# Patient Record
Sex: Male | Born: 1950
Health system: Southern US, Community
[De-identification: ages and names within clinical notes are randomized; demographics above are authoritative.]

## PROBLEM LIST (undated history)

## (undated) DIAGNOSIS — M109 Gout, unspecified: Secondary | ICD-10-CM

## (undated) DIAGNOSIS — M1990 Unspecified osteoarthritis, unspecified site: Secondary | ICD-10-CM

## (undated) DIAGNOSIS — H269 Unspecified cataract: Secondary | ICD-10-CM

## (undated) DIAGNOSIS — N200 Calculus of kidney: Secondary | ICD-10-CM

## (undated) DIAGNOSIS — E785 Hyperlipidemia, unspecified: Secondary | ICD-10-CM

## (undated) DIAGNOSIS — R9431 Abnormal electrocardiogram [ECG] [EKG]: Secondary | ICD-10-CM

## (undated) DIAGNOSIS — K635 Polyp of colon: Secondary | ICD-10-CM

## (undated) DIAGNOSIS — R002 Palpitations: Secondary | ICD-10-CM

## (undated) DIAGNOSIS — I10 Essential (primary) hypertension: Secondary | ICD-10-CM

## (undated) DIAGNOSIS — K219 Gastro-esophageal reflux disease without esophagitis: Secondary | ICD-10-CM

## (undated) DIAGNOSIS — I119 Hypertensive heart disease without heart failure: Secondary | ICD-10-CM

## (undated) DIAGNOSIS — G709 Myoneural disorder, unspecified: Secondary | ICD-10-CM

## (undated) HISTORY — DX: Polyp of colon: K63.5

## (undated) HISTORY — DX: Palpitations: R00.2

## (undated) HISTORY — PX: CORONARY ANGIOPLASTY: SHX604

## (undated) HISTORY — DX: Calculus of kidney: N20.0

## (undated) HISTORY — DX: Hypertensive heart disease without heart failure: I11.9

## (undated) HISTORY — DX: Unspecified cataract: H26.9

## (undated) HISTORY — DX: Unspecified osteoarthritis, unspecified site: M19.90

## (undated) HISTORY — DX: Gout, unspecified: M10.9

## (undated) HISTORY — DX: Hyperlipidemia, unspecified: E78.5

## (undated) HISTORY — DX: Gastro-esophageal reflux disease without esophagitis: K21.9

## (undated) HISTORY — DX: Abnormal electrocardiogram (ECG) (EKG): R94.31

## (undated) HISTORY — DX: Myoneural disorder, unspecified: G70.9

## (undated) HISTORY — DX: Essential (primary) hypertension: I10

---

## 2015-10-21 DIAGNOSIS — E785 Hyperlipidemia, unspecified: Secondary | ICD-10-CM | POA: Diagnosis not present

## 2015-10-21 DIAGNOSIS — K219 Gastro-esophageal reflux disease without esophagitis: Secondary | ICD-10-CM | POA: Diagnosis not present

## 2015-10-21 DIAGNOSIS — I517 Cardiomegaly: Secondary | ICD-10-CM | POA: Diagnosis not present

## 2015-10-21 DIAGNOSIS — I10 Essential (primary) hypertension: Secondary | ICD-10-CM | POA: Diagnosis not present

## 2015-10-21 DIAGNOSIS — E876 Hypokalemia: Secondary | ICD-10-CM | POA: Diagnosis not present

## 2015-10-21 DIAGNOSIS — R7309 Other abnormal glucose: Secondary | ICD-10-CM | POA: Diagnosis not present

## 2015-10-21 DIAGNOSIS — Z833 Family history of diabetes mellitus: Secondary | ICD-10-CM | POA: Diagnosis not present

## 2015-10-21 DIAGNOSIS — E7800 Pure hypercholesterolemia, unspecified: Secondary | ICD-10-CM | POA: Diagnosis not present

## 2015-11-15 DIAGNOSIS — I517 Cardiomegaly: Secondary | ICD-10-CM | POA: Diagnosis not present

## 2015-11-15 DIAGNOSIS — E785 Hyperlipidemia, unspecified: Secondary | ICD-10-CM | POA: Diagnosis not present

## 2015-11-15 DIAGNOSIS — K219 Gastro-esophageal reflux disease without esophagitis: Secondary | ICD-10-CM | POA: Diagnosis not present

## 2015-11-15 DIAGNOSIS — I10 Essential (primary) hypertension: Secondary | ICD-10-CM | POA: Diagnosis not present

## 2015-12-15 DIAGNOSIS — R7309 Other abnormal glucose: Secondary | ICD-10-CM | POA: Diagnosis not present

## 2015-12-15 DIAGNOSIS — Z125 Encounter for screening for malignant neoplasm of prostate: Secondary | ICD-10-CM | POA: Diagnosis not present

## 2015-12-15 DIAGNOSIS — I517 Cardiomegaly: Secondary | ICD-10-CM | POA: Diagnosis not present

## 2015-12-15 DIAGNOSIS — E785 Hyperlipidemia, unspecified: Secondary | ICD-10-CM | POA: Diagnosis not present

## 2015-12-15 DIAGNOSIS — I10 Essential (primary) hypertension: Secondary | ICD-10-CM | POA: Diagnosis not present

## 2016-01-07 DIAGNOSIS — R9431 Abnormal electrocardiogram [ECG] [EKG]: Secondary | ICD-10-CM | POA: Diagnosis not present

## 2016-01-07 DIAGNOSIS — I10 Essential (primary) hypertension: Secondary | ICD-10-CM | POA: Diagnosis not present

## 2016-01-07 DIAGNOSIS — I119 Hypertensive heart disease without heart failure: Secondary | ICD-10-CM | POA: Diagnosis not present

## 2016-02-03 DIAGNOSIS — I119 Hypertensive heart disease without heart failure: Secondary | ICD-10-CM | POA: Diagnosis not present

## 2016-02-21 DIAGNOSIS — Z0000 Encounter for general adult medical examination without abnormal findings: Secondary | ICD-10-CM | POA: Diagnosis not present

## 2016-02-21 DIAGNOSIS — N400 Enlarged prostate without lower urinary tract symptoms: Secondary | ICD-10-CM | POA: Diagnosis not present

## 2016-02-21 DIAGNOSIS — Z125 Encounter for screening for malignant neoplasm of prostate: Secondary | ICD-10-CM | POA: Diagnosis not present

## 2016-02-21 DIAGNOSIS — Q615 Medullary cystic kidney: Secondary | ICD-10-CM | POA: Diagnosis not present

## 2016-02-23 DIAGNOSIS — K219 Gastro-esophageal reflux disease without esophagitis: Secondary | ICD-10-CM | POA: Diagnosis not present

## 2016-02-23 DIAGNOSIS — R7309 Other abnormal glucose: Secondary | ICD-10-CM | POA: Diagnosis not present

## 2016-02-23 DIAGNOSIS — I10 Essential (primary) hypertension: Secondary | ICD-10-CM | POA: Diagnosis not present

## 2016-02-23 DIAGNOSIS — E785 Hyperlipidemia, unspecified: Secondary | ICD-10-CM | POA: Diagnosis not present

## 2016-06-06 DIAGNOSIS — G622 Polyneuropathy due to other toxic agents: Secondary | ICD-10-CM | POA: Diagnosis not present

## 2016-06-06 DIAGNOSIS — I10 Essential (primary) hypertension: Secondary | ICD-10-CM | POA: Diagnosis not present

## 2016-06-06 DIAGNOSIS — E7800 Pure hypercholesterolemia, unspecified: Secondary | ICD-10-CM | POA: Diagnosis not present

## 2016-06-06 DIAGNOSIS — E785 Hyperlipidemia, unspecified: Secondary | ICD-10-CM | POA: Diagnosis not present

## 2016-06-06 DIAGNOSIS — E538 Deficiency of other specified B group vitamins: Secondary | ICD-10-CM | POA: Diagnosis not present

## 2016-06-07 LAB — BASIC METABOLIC PANEL
BUN: 12 mg/dL (ref 4–21)
CREATININE: 1.2 mg/dL (ref 0.6–1.3)
GLUCOSE: 82 mg/dL
Potassium: 3.9 mmol/L (ref 3.4–5.3)
Sodium: 140 mmol/L (ref 137–147)

## 2016-06-07 LAB — LIPID PANEL
Cholesterol: 187 mg/dL (ref 0–200)
HDL: 53 mg/dL (ref 35–70)
LDL CALC: 108 mg/dL
TRIGLYCERIDES: 129 mg/dL (ref 40–160)

## 2016-06-07 LAB — HEPATIC FUNCTION PANEL
ALK PHOS: 88 U/L (ref 25–125)
ALT: 15 U/L (ref 10–40)
AST: 20 U/L (ref 14–40)
BILIRUBIN, TOTAL: 0.6 mg/dL

## 2016-06-07 LAB — CBC AND DIFFERENTIAL
HCT: 40 % — AB (ref 41–53)
Hemoglobin: 13 g/dL — AB (ref 13.5–17.5)
NEUTROS ABS: 58 /uL
PLATELETS: 209 10*3/uL (ref 150–399)
WBC: 5 10*3/mL

## 2016-06-08 ENCOUNTER — Ambulatory Visit (INDEPENDENT_AMBULATORY_CARE_PROVIDER_SITE_OTHER): Payer: Medicare Other | Admitting: Neurology

## 2016-06-08 ENCOUNTER — Encounter: Payer: Self-pay | Admitting: Neurology

## 2016-06-08 VITALS — BP 132/70 | HR 78 | Resp 14 | Ht 67.0 in | Wt 168.0 lb

## 2016-06-08 DIAGNOSIS — G629 Polyneuropathy, unspecified: Secondary | ICD-10-CM

## 2016-06-08 DIAGNOSIS — M79601 Pain in right arm: Secondary | ICD-10-CM

## 2016-06-08 DIAGNOSIS — R202 Paresthesia of skin: Secondary | ICD-10-CM | POA: Diagnosis not present

## 2016-06-08 DIAGNOSIS — M79602 Pain in left arm: Principal | ICD-10-CM

## 2016-06-08 DIAGNOSIS — M79603 Pain in arm, unspecified: Secondary | ICD-10-CM | POA: Diagnosis not present

## 2016-06-08 NOTE — Progress Notes (Signed)
Subjective:    Patient ID: Scott Graves is a 65 y.o. male.  HPI     [name], MD, PhD [facility] [address], [address].O. [place],  [address]     Dear Dr. Criss Rosales,   I saw your patient, Scott Graves, upon your kind request in my neurologic clinic today for initial consultation of his paresthesias in his feet, concern for polyneuropathy. The patient is unaccompanied today. As you know, Scott Graves is a 65 year old right-handed gentleman with an underlying medical history of heart disease, hypertension, hyperlipidemia, reflux disease, gout, and overweight state, who reports pins and needles like sensation in his feet, mostly at night, with symptoms starting about a month ago. He denies any trauma, no falls, no injuries, no weakness, maybe symptoms have plateaued. Symptoms started 1 night as he was in bed with a prick that he felt in his left big toe on the bottom, felt like something had stung him or bitten him, in fact, he searched the bed and bedroom for bugs. He does state that he has had problems with bugs in or around the house and he was spraying pesticide about 3-4 weeks ago and he also had the exterminator come in for treatment in or around the house. Since then, he has intermittent sensation of a prickly feeling in both feet, sometimes in his hands, symptoms seem to be intermittent, no constant numbness, no actual pain but he does have an uncomfortable prickly sensation first and then tingling. He reports no history of diabetes. He has no family history of neuropathy. One brother passed away after stroke. He is the middle child of 9 children. One brother died at age 69 with diabetes and history of alcoholism. Another brother passed away at age 55 with HIV. Another brother died at age 6 with arthritis and history of COPD. He has a brother age 46 who has hypertension and history of stroke. Mother lived to be 37 years old, father lived to be 71 years old.  He has a sister age 45 with hypertension, another younger sister, age 13, with breast cancer and a younger sister age 23. He lives with his wife. He has 1 biological daughter, 2 stepdaughters. He drinks alcohol very occasionally, maybe twice a month, never heavy drinker. Lifelong nonsmoker. He was in the [employer] for 29 years, in [place], [place] and [place]. He was checked for HIV in 2011 at the [place] he says, results negative per patient. He had blood work in your office and he says he was told blood work was fine. We will request test results from your office. I reviewed your office note from 06/06/2016, which you kindly included.  His Past Medical History Is Significant For: Past Medical History:  Diagnosis Date  . Hyperlipemia   . Hypertension     His Past Surgical History Is Significant For: No past surgical history on file.  His Family History Is Significant For: Family History  Problem Relation Age of Onset  . Hypertension Mother   . Diabetes Mother   . Hyperlipidemia Mother   . Hypertension Father   . Hypertension Sister   . Hyperlipidemia Sister   . Heart disease Sister     His Social History Is Significant For: Social History   Social History  . Marital status: Married    Spouse name: N/A  . Number of children: 1  . Years of education: HS   Occupational History  . Retired    Social History Main  Topics  . Smoking status: Never Smoker  . Smokeless tobacco: None  . Alcohol use Yes  . Drug use: No  . Sexual activity: Not Asked   Other Topics Concern  . None   Social History Narrative   Rare caffeine use     His Allergies Are:  Allergies  Allergen Reactions  . Statins   :   His Current Medications Are:  Outpatient Encounter Prescriptions as of 06/08/2016  Medication Sig  . allopurinol (ZYLOPRIM) 300 MG tablet Take 300 mg by mouth daily.  Marland Kitchen MICARDIS HCT 40-12.5 MG tablet   . omeprazole (PRILOSEC) 40 MG capsule   . potassium chloride (K-DUR) 10 MEQ  tablet Take 10 mEq by mouth daily.  Marland Kitchen ZETIA 10 MG tablet    No facility-administered encounter medications on file as of 06/08/2016.   :   Review of Systems:  Out of a complete 14 point review of systems, all are reviewed and negative with the exception of these symptoms as listed below:  Review of Systems  Neurological:       Patient is complaining of paresthesia in feet, legs, hands and arms. This started about 3 weeks ago.     Objective:  Neurologic Exam  Physical Exam Physical Examination:   Vitals:   06/08/16 1606  BP: 132/70  Pulse: 78  Resp: 14    General Examination: The patient is a very pleasant 65 y.o. male in no acute distress. He appears well-developed and well groomed.   HEENT: Normocephalic, atraumatic, pupils are equal, round and reactive to light and accommodation. Funduscopic exam is normal with sharp disc margins noted. Extraocular tracking is good without limitation to gaze excursion or nystagmus noted. Normal smooth pursuit is noted. Hearing is grossly intact. Tympanic membranes are clear bilaterally. Face is symmetric with normal facial animation and normal facial sensation. Speech is clear with no dysarthria noted. There is no hypophonia. There is no lip, neck/head, jaw or voice tremor. Neck is supple with full range of passive and active motion. There are no carotid bruits on auscultation. Oropharynx exam reveals: mild mouth dryness, adequate dental hygiene and mild airway crowding. Tongue protrudes centrally and palate elevates symmetrically.   Chest: Clear to auscultation without wheezing, rhonchi or crackles noted.  Heart: S1+S2+0, regular and normal without murmurs, rubs or gallops noted.   Abdomen: Soft, non-tender and non-distended with normal bowel sounds appreciated on auscultation.  Extremities: There is no pitting edema in the distal lower extremities bilaterally. Pedal pulses are intact.  Skin: Warm and dry without trophic changes noted. There  are no varicose veins.  Musculoskeletal: exam reveals no obvious joint deformities, tenderness or joint swelling or erythema.   Neurologically:  Mental status: The patient is awake, alert and oriented in all 4 spheres. His immediate and remote memory, attention, language skills and fund of knowledge are appropriate. There is no evidence of aphasia, agnosia, apraxia or anomia. Speech is clear with normal prosody and enunciation. Thought process is linear. Mood is normal and affect is normal.  Cranial nerves II - XII are as described above under HEENT exam. In addition: shoulder shrug is normal with equal shoulder height noted. Motor exam: Normal bulk, strength and tone is noted. There is no drift, tremor or rebound. Romberg is negative. Reflexes are 2 + throughout, including ankles. Toes are downgoing bilaterally. Fine motor skills with finger taps, hand movements, rapid alternating patting, foot taps and foot agility are all normal. Cerebellar testing: No dysmetria or intention tremor on  finger to nose testing. Heel to shin is unremarkable bilaterally. There is no truncal or gait ataxia.  Sensory exam: intact to light touch, pinprick, vibration, temperature sense and in the upper and lower extremities, with the exception of decreased pinprick sensation in both feet, and the soles, and in the toes as well as in the palms of both hands.  Gait, station and balance: He stands easily. No veering to one side is noted. No leaning to one side is noted. Posture is age-appropriate and stance is narrow based. Gait shows normal stride length and normal pace. No problems turning are noted. Tandem walk is unremarkable.   Assessment and Plan:   In summary, Scott Graves is a very pleasant 65 y.o.-year old male  with an underlying medical history of heart disease, hypertension, hyperlipidemia, reflux disease, gout, and overweight state, who presents with a 3 to four-week history of paresthesias in both lower and to a  lesser degree in both upper extremities distally, describes a pins and needles sensation, a sharp prick followed by tingling at times. Symptoms seem to be intermittent. On examination he does not have a sinister exam but does have some decrease in pinprick sensation in both feet in the soles particularly and toes, and to some degree in both palms, maybe fingertips. Otherwise he has a very good neurological exam, nonfocal, no atrophy, no fasciculations, no weakness, great coordination, good balance and gait for age. I talked to the patient at length about his symptoms and presentation. We talked about polyneuropathy, etiology of his symptoms is currently unclear.  He had some recent blood work in your office and we will await test results. Diabetes is the most common cause in this [place] for neuropathy. He is not labeled diabetic. He recalls that he probably had a B12 level checked. Nevertheless, we will wait out test results, I will consider further additional blood work such as IFE and SPEP, heavy metal testing, and he is inquiring about Lyme disease. He does not report any tick bite but is not sure because he had several boxes in the house he worries about and even though he does not have a rash, which can certainly look into acute Lyme disease as well. At this juncture, I suggested we proceed with scheduling him for an EMG and nerve conduction test. We will call him with his test results. We will possibly order additional lab work in the near future and he can come in for lab work independently. We will keep them posted as to his test results. I will see him back routinely in a couple months, sooner as needed. I answered all his questions today and he was in agreement.  Thank you very much for allowing me to participate in the care of this nice patient. If I can be of any further assistance to you please do not hesitate to call me at [phone].  Sincerely,   [name], MD, PhD

## 2016-06-08 NOTE — Patient Instructions (Signed)
I would like to investigate things further to look for evidence of neuropathy or nerve damage; therefore, I would like to do some blood work - we will do it at a later date, as you had blood work with Dr. Criss Rosales, and I am waiting for results.   As discussed, we will do electrical testing of your muscles and nerves, which is known as EMG/NCV. Neuropathy or nerve disease or damage can be caused by a variety of causes, most commonly diabetes, some toxins including alcohol or metabolic derangements or hereditary disorders.

## 2016-06-12 DIAGNOSIS — L309 Dermatitis, unspecified: Secondary | ICD-10-CM | POA: Diagnosis not present

## 2016-06-14 ENCOUNTER — Telehealth: Payer: Self-pay | Admitting: Neurology

## 2016-06-14 ENCOUNTER — Encounter: Payer: Self-pay | Admitting: Neurology

## 2016-06-14 DIAGNOSIS — G629 Polyneuropathy, unspecified: Secondary | ICD-10-CM

## 2016-06-14 DIAGNOSIS — M79671 Pain in right foot: Secondary | ICD-10-CM

## 2016-06-14 DIAGNOSIS — M79672 Pain in left foot: Secondary | ICD-10-CM

## 2016-06-14 DIAGNOSIS — R209 Unspecified disturbances of skin sensation: Principal | ICD-10-CM

## 2016-06-14 DIAGNOSIS — IMO0001 Reserved for inherently not codable concepts without codable children: Secondary | ICD-10-CM

## 2016-06-14 NOTE — Telephone Encounter (Signed)
Please call patient to let him know that I reviewed the labs that he had at Dr. Fransico Setters office. I entered additional labs we talked about before and he can come and get him drawn any time.

## 2016-06-14 NOTE — Telephone Encounter (Signed)
LM with information below. I gave our lab hours at this office. Left call back number for further questions.

## 2016-06-15 ENCOUNTER — Other Ambulatory Visit (INDEPENDENT_AMBULATORY_CARE_PROVIDER_SITE_OTHER): Payer: Self-pay

## 2016-06-15 ENCOUNTER — Other Ambulatory Visit: Payer: Self-pay

## 2016-06-15 DIAGNOSIS — G629 Polyneuropathy, unspecified: Secondary | ICD-10-CM

## 2016-06-15 DIAGNOSIS — R202 Paresthesia of skin: Secondary | ICD-10-CM

## 2016-06-15 DIAGNOSIS — M79603 Pain in arm, unspecified: Secondary | ICD-10-CM | POA: Diagnosis not present

## 2016-06-15 DIAGNOSIS — M79601 Pain in right arm: Secondary | ICD-10-CM

## 2016-06-15 DIAGNOSIS — M79602 Pain in left arm: Principal | ICD-10-CM

## 2016-06-15 DIAGNOSIS — Z0289 Encounter for other administrative examinations: Secondary | ICD-10-CM

## 2016-06-19 ENCOUNTER — Telehealth: Payer: Self-pay

## 2016-06-19 LAB — HEAVY METALS PROFILE II, BLOOD
Arsenic: 12 ug/L (ref 2–23)
Cadmium: NOT DETECTED ug/L (ref 0.0–1.2)
Lead, Blood: 1 ug/dL (ref 0–19)
MERCURY: 3.1 ug/L (ref 0.0–14.9)

## 2016-06-19 LAB — TSH: TSH: 1.53 u[IU]/mL (ref 0.450–4.500)

## 2016-06-19 LAB — MULTIPLE MYELOMA PANEL, SERUM
ALPHA2 GLOB SERPL ELPH-MCNC: 1 g/dL (ref 0.4–1.0)
Albumin SerPl Elph-Mcnc: 3.8 g/dL (ref 2.9–4.4)
Albumin/Glob SerPl: 1.2 (ref 0.7–1.7)
Alpha 1: 0.2 g/dL (ref 0.0–0.4)
B-Globulin SerPl Elph-Mcnc: 1.1 g/dL (ref 0.7–1.3)
Gamma Glob SerPl Elph-Mcnc: 0.9 g/dL (ref 0.4–1.8)
Globulin, Total: 3.2 g/dL (ref 2.2–3.9)
IGA/IMMUNOGLOBULIN A, SERUM: 179 mg/dL (ref 61–437)
IGG (IMMUNOGLOBIN G), SERUM: 838 mg/dL (ref 700–1600)
IGM (IMMUNOGLOBULIN M), SRM: 65 mg/dL (ref 20–172)
TOTAL PROTEIN: 7 g/dL (ref 6.0–8.5)

## 2016-06-19 LAB — B. BURGDORFI ANTIBODIES: Lyme IgG/IgM Ab: <0.91 {ISR} (ref 0.00–0.90)

## 2016-06-19 LAB — RPR: RPR: NONREACTIVE

## 2016-06-19 NOTE — Telephone Encounter (Signed)
-----   Message from [name], MD sent at 06/19/2016  7:55 AM EDT ----- Labs normal. Pls notify patient.  [name], MD, PhD [facility])

## 2016-06-19 NOTE — Progress Notes (Signed)
Labs normal. Pls notify patient.  [name], MD, PhD [facility])

## 2016-06-19 NOTE — Telephone Encounter (Signed)
I spoke to patient and he is aware of results/.  

## 2016-07-14 ENCOUNTER — Ambulatory Visit (INDEPENDENT_AMBULATORY_CARE_PROVIDER_SITE_OTHER): Payer: Self-pay | Admitting: Neurology

## 2016-07-14 ENCOUNTER — Ambulatory Visit (INDEPENDENT_AMBULATORY_CARE_PROVIDER_SITE_OTHER): Payer: Medicare Other | Admitting: Neurology

## 2016-07-14 DIAGNOSIS — G629 Polyneuropathy, unspecified: Secondary | ICD-10-CM

## 2016-07-14 DIAGNOSIS — M79601 Pain in right arm: Secondary | ICD-10-CM

## 2016-07-14 DIAGNOSIS — M79602 Pain in left arm: Principal | ICD-10-CM

## 2016-07-14 DIAGNOSIS — Z0289 Encounter for other administrative examinations: Secondary | ICD-10-CM

## 2016-07-14 DIAGNOSIS — R202 Paresthesia of skin: Secondary | ICD-10-CM | POA: Diagnosis not present

## 2016-07-14 NOTE — Progress Notes (Signed)
Please call and advise the patient that the recent EMG and nerve conduction velocity test, which is the electrical nerve and muscle test we we performed, was reported as within normal limits. We checked for abnormal electrical discharges in the muscles or nerves and the report suggested normal findings. No further action is required on this test at this time. Thanks,  [name], MD, PhD

## 2016-07-14 NOTE — Procedures (Signed)
   NCS (NERVE CONDUCTION STUDY) WITH EMG (ELECTROMYOGRAPHY) REPORT   STUDY DATE: July 14 2016 PATIENT NAME: Scott Graves DOB: 1951/09/22 MRN: [id number]    TECHNOLOGIST: Laretta Alstrom   ELECTROMYOGRAPHER: Marcial Pacas M.D.  CLINICAL INFORMATION:  65 years old right-handed male presented with two-month history of intermittent bilateral upper and lower extremity paresthesia  FINDINGS: NERVE CONDUCTION STUDY: Bilateral peroneal sensory responses were normal. Bilateral peroneal to EDB and tibial motor responses were normal. Bilateral tibial H reflexes were normal and symmetric.  Left median, ulnar sensory and motor responses were normal.  NEEDLE ELECTROMYOGRAPHY: Selective needle examinations were performed at right upper, lower extremity muscles and the right cervical, lumbar sacral paraspinal muscles.  Needle examination of right first dorsal interossei, pronator teres, biceps, triceps, deltoid was normal.  There was no spontaneous activity at right cervical paraspinal muscles, right C5, C6-7.  Needle examination of right tibialis anterior, tibialis posterior, medial gastrocnemius, peroneal longus, vastus lateralis, biceps femoris long head was normal.  There was no spontaneous activity at right L4-5 S1.  IMPRESSION:   This is a normal study, there is no electrodiagnostic evidence of right upper or lower extremity neuropathy, there is no evidence of right cervical or right lumbosacral radiculopathy.   INTERPRETING PHYSICIAN:   Marcial Pacas M.D. Ph.D. [facility] [address] [place], [place] [address] [phone]

## 2016-07-14 NOTE — Progress Notes (Signed)
See EMG nerve conduction study under the procedure section

## 2016-07-19 ENCOUNTER — Telehealth: Payer: Self-pay

## 2016-07-19 DIAGNOSIS — I10 Essential (primary) hypertension: Secondary | ICD-10-CM | POA: Diagnosis not present

## 2016-07-19 DIAGNOSIS — R7309 Other abnormal glucose: Secondary | ICD-10-CM | POA: Diagnosis not present

## 2016-07-19 DIAGNOSIS — G622 Polyneuropathy due to other toxic agents: Secondary | ICD-10-CM | POA: Diagnosis not present

## 2016-07-19 DIAGNOSIS — E785 Hyperlipidemia, unspecified: Secondary | ICD-10-CM | POA: Diagnosis not present

## 2016-07-19 NOTE — Telephone Encounter (Signed)
LM per DPR with results and recommendations below. Left call back number for questions. Left date and time for next office visit.

## 2016-07-19 NOTE — Telephone Encounter (Signed)
-----   Message from [name], MD sent at 07/14/2016 10:25 AM EDT ----- Please call and advise the patient that the recent EMG and nerve conduction velocity test, which is the electrical nerve and muscle test we we performed, was reported as within normal limits. We checked for abnormal electrical discharges in the muscles or nerves and the report suggested normal findings. No further action is required on this test at this time. Thanks,  [name], MD, PhD

## 2016-07-20 DIAGNOSIS — N3281 Overactive bladder: Secondary | ICD-10-CM | POA: Diagnosis not present

## 2016-07-20 DIAGNOSIS — R3915 Urgency of urination: Secondary | ICD-10-CM | POA: Diagnosis not present

## 2016-08-18 DIAGNOSIS — R7309 Other abnormal glucose: Secondary | ICD-10-CM | POA: Diagnosis not present

## 2016-08-18 DIAGNOSIS — I10 Essential (primary) hypertension: Secondary | ICD-10-CM | POA: Diagnosis not present

## 2016-08-18 DIAGNOSIS — G622 Polyneuropathy due to other toxic agents: Secondary | ICD-10-CM | POA: Diagnosis not present

## 2016-09-12 ENCOUNTER — Ambulatory Visit (INDEPENDENT_AMBULATORY_CARE_PROVIDER_SITE_OTHER): Payer: Medicare Other | Admitting: Neurology

## 2016-09-12 ENCOUNTER — Encounter: Payer: Self-pay | Admitting: Neurology

## 2016-09-12 VITALS — BP 122/70 | HR 72 | Resp 16 | Ht 67.0 in | Wt 183.0 lb

## 2016-09-12 DIAGNOSIS — R202 Paresthesia of skin: Secondary | ICD-10-CM

## 2016-09-12 NOTE — Patient Instructions (Signed)
Your exam is stable.  Your tests were reassuring.  You can continue with the Lyrica, may consider taking it at night only.  I will see you back as needed. Hope, you continue to do well.

## 2016-09-12 NOTE — Progress Notes (Signed)
Subjective:    Patient ID: Scott Graves is a 65 y.o. male.  HPI     Interim history:   Mr. Lysaght is a 65 year old right-handed gentleman with an underlying medical history of heart disease, hypertension, hyperlipidemia, reflux disease, gout, and overweight state, who presents for follow-up consultation of his paresthesias. The patient is unaccompanied today. I first met him on 06/08/2016 at the request of his primary care physician, at which time he reported pins and needles like sensation in his feet, mostly at night, with symptoms that started about a month prior. His examination was fairly benign. I proceeded with further workup in the form of lab work and EMG and nerve conduction testing.   He had labs on 06/15/2016 which showed normal TSH, unremarkable RPR, normal multiple myeloma panel, unremarkable heavy metals and blood and negative Lyme antibodies.  He had EMG and nerve conduction testing of both lower extremities on 07/14/2016 and I reviewed the results: IMPRESSION:    This is a normal study, there is no electrodiagnostic evidence of right upper or lower extremity neuropathy, there is no evidence of right cervical or right lumbosacral radiculopathy. We called him with his test results.   Today, 09/12/2016: He reports that he has been on Lyrica, 75 mg bid per PCP for the past month, with improvement in the paresthesias, has not noted any new Sx. Worries about his wife, who has a lot of medical problems, has one daughter in [place], 2 step daughters in [place]. Has reduced his water intake some at night. Symptoms of tingling are primarily at night, does not typically have any trouble sleeping or waking up with symptoms in the middle of the night. Has not noticed any side effects with the Lyrica at the current dose. His urologist started him on tamsulosin.   Previously:  06/08/2016: He reports pins and needles like sensation in his feet, mostly at night, with symptoms starting about a  month ago. He denies any trauma, no falls, no injuries, no weakness, maybe symptoms have plateaued. Symptoms started 1 night as he was in bed with a prick that he felt in his left big toe on the bottom, felt like something had stung him or bitten him, in fact, he searched the bed and bedroom for bugs. He does state that he has had problems with bugs in or around the house and he was spraying pesticide about 3-4 weeks ago and he also had the exterminator come in for treatment in or around the house. Since then, he has intermittent sensation of a prickly feeling in both feet, sometimes in his hands, symptoms seem to be intermittent, no constant numbness, no actual pain but he does have an uncomfortable prickly sensation first and then tingling. He reports no history of diabetes. He has no family history of neuropathy. One brother passed away after stroke. He is the middle child of 9 children. One brother died at age 71 with diabetes and history of alcoholism. Another brother passed away at age 70 with HIV. Another brother died at age 60 with arthritis and history of COPD. He has a brother age 47 who has hypertension and history of stroke. Mother lived to be 12 years old, father lived to be 54 years old. He has a sister age 72 with hypertension, another younger sister, age 26, with breast cancer and a younger sister age 87. He lives with his wife. He has 1 biological daughter, 2 stepdaughters. He drinks alcohol very occasionally, maybe twice a month,  never heavy drinker. Lifelong nonsmoker. He was in the [employer] for 29 years, in [place], [place] and [place]. He was checked for HIV in 2011 at the [place] he says, results negative per patient. He had blood work in your office and he says he was told blood work was fine. We will request test results from your office. I reviewed your office note from 06/06/2016, which you kindly included.  His Past Medical History Is Significant For: Past Medical History:  Diagnosis  Date  . Hyperlipemia   . Hypertension     His Past Surgical History Is Significant For: No past surgical history on file.  His Family History Is Significant For: Family History  Problem Relation Age of Onset  . Hypertension Mother   . Diabetes Mother   . Hyperlipidemia Mother   . Hypertension Father   . Hypertension Sister   . Hyperlipidemia Sister   . Heart disease Sister     His Social History Is Significant For: Social History   Social History  . Marital status: Married    Spouse name: N/A  . Number of children: 1  . Years of education: HS   Occupational History  . Retired    Social History Main Topics  . Smoking status: Never Smoker  . Smokeless tobacco: None  . Alcohol use Yes  . Drug use: No  . Sexual activity: Not Asked   Other Topics Concern  . None   Social History Narrative   Rare caffeine use     His Allergies Are:  Allergies  Allergen Reactions  . Statins   :   His Current Medications Are:  Outpatient Encounter Prescriptions as of 09/12/2016  Medication Sig  . allopurinol (ZYLOPRIM) 300 MG tablet Take 300 mg by mouth daily.  Marland Kitchen MICARDIS HCT 40-12.5 MG tablet   . omeprazole (PRILOSEC) 40 MG capsule   . potassium chloride (K-DUR) 10 MEQ tablet Take 10 mEq by mouth daily.  . pregabalin (LYRICA) 75 MG capsule Take 75 mg by mouth 2 (two) times daily.  Marland Kitchen ZETIA 10 MG tablet    No facility-administered encounter medications on file as of 09/12/2016.   :  Review of Systems:  Out of a complete 14 point review of systems, all are reviewed and negative with the exception of these symptoms as listed below: Review of Systems  Neurological:       No new concerns per patient.     Objective:  Neurologic Exam  Physical Exam Physical Examination:   Vitals:   09/12/16 0822  BP: 122/70  Pulse: 72  Resp: 16   General Examination: The patient is a very pleasant 65 y.o. male in no acute distress. He appears well-developed and well groomed. He is  in good spirits today.   HEENT: Normocephalic, atraumatic, pupils are equal, round and reactive to light and accommodation. Extraocular tracking is good without limitation to gaze excursion or nystagmus noted. Normal smooth pursuit is noted. Mild bilateral cataracts are noted. Hearing is grossly intact. Face is symmetric with normal facial animation and normal facial sensation. Speech is clear with no dysarthria noted. There is no hypophonia. There is no lip, neck/head, jaw or voice tremor. Neck is supple with full range of passive and active motion. There are no carotid bruits on auscultation. Oropharynx exam reveals: mild mouth dryness, adequate dental hygiene and mild airway crowding. Tongue protrudes centrally and palate elevates symmetrically.   Chest: Clear to auscultation without wheezing, rhonchi or crackles noted.  Heart: S1+S2+0, regular  and normal without murmurs, rubs or gallops noted.   Abdomen: Soft, non-tender and non-distended with normal bowel sounds appreciated on auscultation.  Extremities: There is no pitting edema in the distal lower extremities bilaterally. Pedal pulses are intact.  Skin: Warm and dry without trophic changes noted. There are no varicose veins.  Musculoskeletal: exam reveals no obvious joint deformities, tenderness or joint swelling or erythema.   Neurologically:  Mental status: The patient is awake, alert and oriented in all 4 spheres. His immediate and remote memory, attention, language skills and fund of knowledge are appropriate. There is no evidence of aphasia, agnosia, apraxia or anomia. Speech is clear with normal prosody and enunciation. Thought process is linear. Mood is normal and affect is normal.  Cranial nerves II - XII are as described above under HEENT exam. In addition: shoulder shrug is normal with equal shoulder height noted. Motor exam: Normal bulk, strength and tone is noted. There is no drift, tremor or rebound. Romberg is negative.  Reflexes are 2+ throughout, including ankles. Fine motor skills with finger taps, hand movements, rapid alternating patting, foot taps and foot agility are all normal. Cerebellar testing: No dysmetria or intention tremor on finger to nose testing. Heel to shin is unremarkable bilaterally. There is no truncal or gait ataxia.  Sensory exam: intact to light touch, pinprick, vibration, temperature sense and in the upper and lower extremities, with the exception of mildly decreased pinprick sensation in both feet, and the soles, and in the toes as well as in the palms of both hands, stable.  Gait, station and balance: He stands easily. No veering to one side is noted. No leaning to one side is noted. Posture is age-appropriate and stance is narrow based. Gait shows normal stride length and normal pace. No problems turning are noted. Tandem walk is unremarkable, very good for age in fact.   Assessment and Plan:   In summary, Nyzier Nevills is a very pleasant 65 year old male  with an underlying medical history of heart disease, hypertension, hyperlipidemia, reflux disease, gout, and overweight state, who presents for follow-up consultation of his approximately three-month history of paresthesias in the upper and lower extremities, primarily at night and with mostly intermittent symptoms, workup was benign in the form of blood work which we did in August 2017 and EMG and nerve conduction testing which we did in September 2017. For symptomatic relief, his primary care physician has started him on Lyrica low dose and he feels that it has helped, and denies any side effects. He can certainly continue with his from my end of things. His exam is stable, I reassured him in that regard. I can at this point see him on an as-needed basis, the only suggestion I might have is that he could take the Lyrica perhaps only in the evening since his symptoms are more pronounced in the evening and he may be able to get by with evening  dosing only. He is encouraged to consider this and talk with his PCP about it. Furthermore, he is encouraged to make an appointment for an eye exam as it has been more than 2 years and mild bilateral cataracts are noted. I answered all his questions today and he was in agreement.  I spent 25 minutes in total face-to-face time with the patient, more than 50% of which was spent in counseling and coordination of care, reviewing test results, reviewing medication and discussing or reviewing the diagnosis of paresthesias, the prognosis and treatment options.

## 2016-09-19 ENCOUNTER — Encounter: Payer: Self-pay | Admitting: Family

## 2016-09-19 ENCOUNTER — Ambulatory Visit (INDEPENDENT_AMBULATORY_CARE_PROVIDER_SITE_OTHER): Payer: Medicare Other | Admitting: Family

## 2016-09-19 VITALS — BP 144/94 | HR 73 | Temp 98.1°F | Resp 16 | Ht 67.0 in | Wt 187.0 lb

## 2016-09-19 DIAGNOSIS — Z23 Encounter for immunization: Secondary | ICD-10-CM

## 2016-09-19 DIAGNOSIS — K219 Gastro-esophageal reflux disease without esophagitis: Secondary | ICD-10-CM | POA: Diagnosis not present

## 2016-09-19 DIAGNOSIS — M109 Gout, unspecified: Secondary | ICD-10-CM | POA: Insufficient documentation

## 2016-09-19 DIAGNOSIS — I10 Essential (primary) hypertension: Secondary | ICD-10-CM | POA: Diagnosis not present

## 2016-09-19 DIAGNOSIS — R202 Paresthesia of skin: Secondary | ICD-10-CM | POA: Diagnosis not present

## 2016-09-19 NOTE — Assessment & Plan Note (Signed)
Previous a diagnosed with gout and maintained on allopurinol which she has stopped taking recently with no recent flares. Recheck uric acid levels at physical. Continue to monitor and follow-up as needed.

## 2016-09-19 NOTE — Assessment & Plan Note (Signed)
Blood pressure remains elevated above goal of 150/90 with current regimen as he indicates he has not taken his medication in the last 3-4 days. Encouraged compliance with blood pressure medication to prevent symptoms of end organ damage in the future. Monitor blood pressure at home and follow low-sodium diet. Continue to monitor.

## 2016-09-19 NOTE — Patient Instructions (Addendum)
Thank you for choosing [facility].  SUMMARY AND INSTRUCTIONS:  Recommend trial of supplements for numbness/tingling including Benfotiamine 300 mg daily and Alpha-lipoic acid 600 mg.   Schedule a time for your physical at your convenience.   Medication:  Please continue to take your medications as prescribed.  Follow up:  If your symptoms worsen or fail to improve, please contact our office for further instruction, or in case of emergency go directly to the emergency room at the closest medical facility.    Peripheral Neuropathy Peripheral neuropathy is a type of nerve damage. It affects nerves that carry signals between the spinal cord and other parts of the body. These are called peripheral nerves. With peripheral neuropathy, one nerve or a group of nerves may be damaged.  CAUSES  Many things can damage peripheral nerves. For some people with peripheral neuropathy, the cause is unknown. Some causes include:  Diabetes. This is the most common cause of peripheral neuropathy.  Injury to a nerve.  Pressure or stress on a nerve that lasts a long time.  Too little vitamin B. Alcoholism can lead to this.  Infections.  Autoimmune diseases, such as multiple sclerosis and systemic lupus erythematosus.  Inherited nerve diseases.  Some medicines, such as cancer drugs.  Toxic substances, such as lead and mercury.  Too little blood flowing to the legs.  Kidney disease.  Thyroid disease. SIGNS AND SYMPTOMS  Different people have different symptoms. The symptoms you have will depend on which of your nerves is damaged. Common symptoms include:  Loss of feeling (numbness) in the feet and hands.  Tingling in the feet and hands.  Pain that burns.  Very sensitive skin.  Weakness.  Not being able to move a part of the body (paralysis).  Muscle twitching.  Clumsiness or poor coordination.  Loss of balance.  Not being able to control your bladder.  Feeling  dizzy.  Sexual problems. DIAGNOSIS  Peripheral neuropathy is a symptom, not a disease. Finding the cause of peripheral neuropathy can be hard. To figure that out, your health care provider will take a medical history and do a physical exam. A neurological exam will also be done. This involves checking things affected by your brain, spinal cord, and nerves (nervous system). For example, your health care provider will check your reflexes, how you move, and what you can feel.  Other types of tests may also be ordered, such as:  Blood tests.  A test of the fluid in your spinal cord.  Imaging tests, such as CT scans or an MRI.  Electromyography (EMG). This test checks the nerves that control muscles.  Nerve conduction velocity tests. These tests check how fast messages pass through your nerves.  Nerve biopsy. A small piece of nerve is removed. It is then checked under a microscope. TREATMENT   Medicine is often used to treat peripheral neuropathy. Medicines may include:  Pain-relieving medicines. Prescription or over-the-counter medicine may be suggested.  Antiseizure medicine. This may be used for pain.  Antidepressants. These also may help ease pain from neuropathy.  Lidocaine. This is a numbing medicine. You might wear a patch or be given a shot.  Mexiletine. This medicine is typically used to help control irregular heart rhythms.  Surgery. Surgery may be needed to relieve pressure on a nerve or to destroy a nerve that is causing pain.  Physical therapy to help movement.  Assistive devices to help movement. HOME CARE INSTRUCTIONS   Only take over-the-counter or prescription medicines as  directed by your health care provider. Follow the instructions carefully for any given medicines. Do not take any other medicines without first getting approval from your health care provider.  If you have diabetes, work closely with your health care provider to keep your blood sugar under  control.  If you have numbness in your feet:  Check every day for signs of injury or infection. Watch for redness, warmth, and swelling.  Wear padded socks and comfortable shoes. These help protect your feet.  Do not do things that put pressure on your damaged nerve.  Do not smoke. Smoking keeps blood from getting to damaged nerves.  Avoid or limit alcohol. Too much alcohol can cause a lack of B vitamins. These vitamins are needed for healthy nerves.  Develop a good support system. Coping with peripheral neuropathy can be stressful. Talk to a mental health specialist or join a support group if you are struggling.  Follow up with your health care provider as directed. SEEK MEDICAL CARE IF:   You have new signs or symptoms of peripheral neuropathy.  You are struggling emotionally from dealing with peripheral neuropathy.  You have a fever. SEEK IMMEDIATE MEDICAL CARE IF:   You have an injury or infection that is not healing.  You feel very dizzy or begin vomiting.  You have chest pain.  You have trouble breathing.   This information is not intended to replace advice given to you by your health care provider. Make sure you discuss any questions you have with your health care provider.   Document Released: 10/20/2002 Document Revised: 07/12/2011 Document Reviewed: 07/07/2013 Elsevier Interactive Patient Education [employer].

## 2016-09-19 NOTE — Progress Notes (Signed)
Subjective:    Patient ID: Scott Graves, male    DOB: [birth year], 65 y.o.   MRN: [id number]  Chief Complaint  Patient presents with  . Establish Care    wants all records under [facility], no issues to discuss today    HPI:  Scott Graves is a 65 y.o. male who  has a past medical history of Arthritis; Gout; Hyperlipemia; Hypertension; and Kidney stones. and presents today for an office visit to establish care.   1.) Gout - Previously prescribed Allopurinol for gout prevention which she indicates he has stopped taking over the last several weeks as he has not had a gout flare and 3 years. Flares generally occur in the left great toe. Symptoms have been well controlled with no recent flares.  2.) Hypertension - currently maintained on Micardis. Reports he has not taken the medication the past 3-4 days secondary to concern for his paresthesias. Does not currently check blood pressure at home. Denies worse headache of life or new symptoms of end organ damage. Completed a 2-D echocardiogram previously which he reports was normal. Indicates he has not been eating right recently since moving to [place] from [place].  BP Readings from Last 3 Encounters:  09/19/16 (!) 144/94  09/12/16 122/70  06/08/16 132/70    3.) Parasethesia - New onset paresthesias starting from his toe on left side and progressing through his bilateral lower extremities and upper extremities. Currently maintained on Lyrica prescribed by former PCP. Notes symptoms are adequately controlled with current medication regimen. Has been evaluated by neurology with no significant findings on EMG or blood work. Neurology recommendation to use their care at night as this is when he has most of his symptoms.  4.) Reflux - currently maintained on omeprazole which she reports taking as needed. Denies adverse side effects and notes symptoms are generally well controlled with medication regimen.   Allergies  Allergen Reactions  .  Statins       Outpatient Medications Prior to Visit  Medication Sig Dispense Refill  . allopurinol (ZYLOPRIM) 300 MG tablet Take 300 mg by mouth daily.    Marland Kitchen MICARDIS HCT 40-12.5 MG tablet     . omeprazole (PRILOSEC) 40 MG capsule     . potassium chloride (K-DUR) 10 MEQ tablet Take 10 mEq by mouth daily.    Marland Kitchen ZETIA 10 MG tablet     . pregabalin (LYRICA) 75 MG capsule Take 75 mg by mouth 2 (two) times daily.     No facility-administered medications prior to visit.       No past surgical history on file.    Past Medical History:  Diagnosis Date  . Arthritis   . Gout   . Hyperlipemia   . Hypertension   . Kidney stones       Review of Systems  Constitutional: Negative for chills and fever.  Eyes:       Negative for changes in vision  Respiratory: Negative for cough, chest tightness and wheezing.   Cardiovascular: Negative for chest pain, palpitations and leg swelling.  Musculoskeletal: Negative for arthralgias and joint swelling.  Neurological: Positive for numbness. Negative for dizziness, weakness and light-headedness.      Objective:    BP (!) 144/94 (BP Location: Left Arm, Patient Position: Sitting, Cuff Size: Normal)   Pulse 73   Temp 98.1 F (36.7 C) (Oral)   Resp 16   Ht 5\' 7"  (1.702 m)   Wt 187 lb (84.8 kg)   SpO2  96%   BMI 29.29 kg/m  Nursing note and vital signs reviewed.  Physical Exam  Constitutional: He is oriented to person, place, and time. He appears well-developed and well-nourished. No distress.  Cardiovascular: Normal rate, regular rhythm, normal heart sounds and intact distal pulses.   Pulmonary/Chest: Effort normal and breath sounds normal.  Musculoskeletal:  Bilateral lower extremities - no obvious deformity, discoloration, or edema. Distal pulses and sensation are intact and appropriate. There is generalized numbness/tingling.  Bilateral upper extremities - no obvious deformity, discoloration, or edema. Distal pulses and sensation are  intact and appropriate. There is generalized numbness and tingling with discomfort noted in the left fingertips.  Neurological: He is alert and oriented to person, place, and time.  Skin: Skin is warm and dry.  Psychiatric: He has a normal mood and affect. His behavior is normal. Judgment and thought content normal.       Assessment & Plan:   Problem List Items Addressed This Visit      Cardiovascular and Mediastinum   Essential hypertension - Primary    Blood pressure remains elevated above goal of 150/90 with current regimen as he indicates he has not taken his medication in the last 3-4 days. Encouraged compliance with blood pressure medication to prevent symptoms of end organ damage in the future. Monitor blood pressure at home and follow low-sodium diet. Continue to monitor.        Digestive   GERD (gastroesophageal reflux disease)    Currently maintained on omeprazole taken as needed without adverse side effects. Recommend continue omeprazole as needed secondary to possible side effect of decreasing B12 and given paresthesias which may ultimately contribute. Continue to monitor.        Other   Gout    Previous a diagnosed with gout and maintained on allopurinol which she has stopped taking recently with no recent flares. Recheck uric acid levels at physical. Continue to monitor and follow-up as needed.      Paresthesia    Currently maintained on Lyrica without adverse side effects. Symptoms appear adequate control with current medication regimen. Does continue to experience some tingling located in his upper and lower extremities. Continue current dosage of Lyrica. Trial supplements of Benfotiamine and Alpha-lipoic acid. Neurology follow-up as needed. Continue to monitor.          I am having Mr. Dutan maintain his MICARDIS HCT, ZETIA, omeprazole, allopurinol, potassium chloride, tolterodine, and pregabalin.   Meds ordered this encounter  Medications  . tolterodine  (DETROL LA) 4 MG 24 hr capsule    Sig: Take 4 mg by mouth daily.  . pregabalin (LYRICA) 225 MG capsule    Sig: Take 225 mg by mouth daily.     Follow-up: Return in about 1 month (around 10/19/2016), or if symptoms worsen or fail to improve.  Mauricio Po, FNP

## 2016-09-19 NOTE — Assessment & Plan Note (Signed)
Currently maintained on omeprazole taken as needed without adverse side effects. Recommend continue omeprazole as needed secondary to possible side effect of decreasing B12 and given paresthesias which may ultimately contribute. Continue to monitor.

## 2016-09-19 NOTE — Assessment & Plan Note (Addendum)
Currently maintained on Lyrica without adverse side effects. Symptoms appear adequate control with current medication regimen. Does continue to experience some tingling located in his upper and lower extremities. Continue current dosage of Lyrica. Trial supplements of Benfotiamine and Alpha-lipoic acid. Neurology follow-up as needed. Continue to monitor.

## 2016-09-29 DIAGNOSIS — G622 Polyneuropathy due to other toxic agents: Secondary | ICD-10-CM | POA: Diagnosis not present

## 2016-09-29 DIAGNOSIS — R7309 Other abnormal glucose: Secondary | ICD-10-CM | POA: Diagnosis not present

## 2016-09-29 DIAGNOSIS — I10 Essential (primary) hypertension: Secondary | ICD-10-CM | POA: Diagnosis not present

## 2016-09-29 DIAGNOSIS — E7800 Pure hypercholesterolemia, unspecified: Secondary | ICD-10-CM | POA: Diagnosis not present

## 2016-10-04 ENCOUNTER — Encounter: Payer: Self-pay | Admitting: Family

## 2016-10-23 DIAGNOSIS — H10413 Chronic giant papillary conjunctivitis, bilateral: Secondary | ICD-10-CM | POA: Diagnosis not present

## 2016-10-23 DIAGNOSIS — H35033 Hypertensive retinopathy, bilateral: Secondary | ICD-10-CM | POA: Diagnosis not present

## 2016-10-23 DIAGNOSIS — H25813 Combined forms of age-related cataract, bilateral: Secondary | ICD-10-CM | POA: Diagnosis not present

## 2016-11-16 DIAGNOSIS — R7309 Other abnormal glucose: Secondary | ICD-10-CM | POA: Diagnosis not present

## 2016-11-16 DIAGNOSIS — F064 Anxiety disorder due to known physiological condition: Secondary | ICD-10-CM | POA: Diagnosis not present

## 2016-11-16 DIAGNOSIS — I517 Cardiomegaly: Secondary | ICD-10-CM | POA: Diagnosis not present

## 2016-11-16 DIAGNOSIS — E876 Hypokalemia: Secondary | ICD-10-CM | POA: Diagnosis not present

## 2016-12-22 DIAGNOSIS — Z125 Encounter for screening for malignant neoplasm of prostate: Secondary | ICD-10-CM | POA: Diagnosis not present

## 2016-12-22 DIAGNOSIS — E785 Hyperlipidemia, unspecified: Secondary | ICD-10-CM | POA: Diagnosis not present

## 2016-12-22 DIAGNOSIS — R7309 Other abnormal glucose: Secondary | ICD-10-CM | POA: Diagnosis not present

## 2016-12-22 DIAGNOSIS — E876 Hypokalemia: Secondary | ICD-10-CM | POA: Diagnosis not present

## 2016-12-22 DIAGNOSIS — F064 Anxiety disorder due to known physiological condition: Secondary | ICD-10-CM | POA: Diagnosis not present

## 2016-12-22 DIAGNOSIS — I10 Essential (primary) hypertension: Secondary | ICD-10-CM | POA: Diagnosis not present

## 2016-12-22 DIAGNOSIS — E7800 Pure hypercholesterolemia, unspecified: Secondary | ICD-10-CM | POA: Diagnosis not present

## 2017-01-24 ENCOUNTER — Encounter (INDEPENDENT_AMBULATORY_CARE_PROVIDER_SITE_OTHER): Payer: Self-pay | Admitting: Orthopedic Surgery

## 2017-01-24 ENCOUNTER — Ambulatory Visit (INDEPENDENT_AMBULATORY_CARE_PROVIDER_SITE_OTHER): Payer: Medicare Other | Admitting: Orthopedic Surgery

## 2017-01-24 ENCOUNTER — Ambulatory Visit (INDEPENDENT_AMBULATORY_CARE_PROVIDER_SITE_OTHER): Payer: Medicare Other

## 2017-01-24 VITALS — BP 123/75 | HR 87 | Resp 12 | Ht 67.0 in | Wt 196.0 lb

## 2017-01-24 DIAGNOSIS — M25522 Pain in left elbow: Secondary | ICD-10-CM

## 2017-01-24 DIAGNOSIS — M7712 Lateral epicondylitis, left elbow: Secondary | ICD-10-CM

## 2017-01-24 MED ORDER — METHYLPREDNISOLONE ACETATE 40 MG/ML IJ SUSP
30.0000 mg | INTRAMUSCULAR | Status: AC | PRN
  Administered 2017-01-24: 30 mg via INTRA_ARTICULAR

## 2017-01-24 MED ORDER — LIDOCAINE HCL 1 % IJ SOLN
3.0000 mL | INTRAMUSCULAR | Status: AC | PRN
  Administered 2017-01-24: 3 mL

## 2017-01-24 MED ORDER — BUPIVACAINE HCL 0.5 % IJ SOLN
1.0000 mL | INTRAMUSCULAR | Status: AC | PRN
  Administered 2017-01-24: 1 mL via INTRA_ARTICULAR

## 2017-01-24 NOTE — Progress Notes (Signed)
Office Visit Note   Patient: Scott Graves           Date of Birth: [birth year]           MRN: [id number] Visit Date: 01/24/2017              Requested by: Golden Circle, FNP [place], [place] [address] PCP: Elyn Peers, MD   Assessment & Plan: Visit Diagnoses:  1. Lateral epicondylitis, left elbow   2. Elbow pain, left     Plan:  #1: Cortical steroid injection was given to the left lateral epicondylar area. He had marked relief in his pain :  Follow-Up Instructions: Return if symptoms worsen or fail to improve.   Orders:  Orders Placed This Encounter  Procedures  . Medium Joint Injection/Arthrocentesis  . XR Elbow 2 Views Left   No orders of the defined types were placed in this encounter.     Procedures: Medium Joint Inj Date/Time: 01/24/2017 11:18 AM Performed by: Biagio Borg D Authorized by: Biagio Borg D   Consent Given by:  Patient Site marked: the procedure site was marked   Timeout: prior to procedure the correct patient, procedure, and site was verified   Indications:  Pain Location:  Elbow Site:  L lateral epicondyle Needle Size:  25 G Needle Length:  1.5 inches Approach:  Anterolateral Ultrasound Guided: No   Fluoroscopic Guidance: No   Medications:  3 mL lidocaine 1 %; 30 mg methylPREDNISolone acetate 40 MG/ML; 1 mL bupivacaine 0.5 % Aspiration Attempted: No        Clinical Data: No additional findings.   Subjective: Chief Complaint  Patient presents with  . Left Elbow - Pain    Scott Graves is a very pleasant 66 year old African-American male who is seen today for evaluation of his left elbow. Had some intermittent cervical spine pain which have been about 3 weeks ago and he feels as if he has slept wrong on it but is steadily improved. He denies any radicular type symptoms at this time into the arm though.  The left elbow hurts along the lateral aspect of his elbow. He has difficulty with gripping which does cause pain  there. Is also noted at times that he is limited range of motion because of the pain and discomfort. Denies any numbness or tingling or radicular symptoms into the hand. Dr. Criss Rosales had placed him on Lodine in February but did not have any improvement. He denies any history of injury or trauma to this area.      Review of Systems  Constitutional: Negative.   HENT: Negative.   Respiratory: Negative.   Cardiovascular:       History of hypertension  Gastrointestinal: Negative.   Genitourinary: Negative.   Skin: Negative.   Neurological: Negative.   Hematological: Negative.   Psychiatric/Behavioral: Negative.      Objective: Vital Signs: BP 123/75 (BP Location: Left Arm, Patient Position: Sitting, Cuff Size: Normal)   Pulse 87   Resp 12   Ht 5\' 7"  (1.702 m)   Wt 196 lb (88.9 kg)   BMI 30.70 kg/m   Physical Exam  Constitutional: He is oriented to person, place, and time. He appears well-developed and well-nourished.  HENT:  Head: Normocephalic and atraumatic.  Eyes: EOM are normal. Pupils are equal, round, and reactive to light.  Pulmonary/Chest: Effort normal.  Neurological: He is alert and oriented to person, place, and time.  Skin: Skin is warm and dry.  Psychiatric: He has  a normal mood and affect. His behavior is normal. Judgment and thought content normal.    Left Elbow Exam   Tenderness  The patient is experiencing tenderness in the lateral epicondyle.   Range of Motion  Left elbow extension: 3.  Flexion: 120  Left elbow pronation: 70.  Left elbow supination: 70.   Tests Varus: negative Valgus: negative    Other  Erythema: absent Scars: absent Sensation: normal Pulse: present  Comments:  He has pain with resistance against dorsiflexion of the hand. Tender over the lateral epicondyles. No effusion noted.      Specialty Comments:  No specialty comments available.  Imaging: Xr Elbow 2 Views Left  Result Date: 01/24/2017 2 view left elbow reveals  some scalloping at the lateral epicondylar region. This was sclerosing also noted there. There may be some spurring at the triceps insertion on the olecranon.    PMFS History: Patient Active Problem List   Diagnosis Date Noted  . Gout 09/19/2016  . Essential hypertension 09/19/2016  . Paresthesia 09/19/2016  . GERD (gastroesophageal reflux disease) 09/19/2016   Past Medical History:  Diagnosis Date  . Arthritis   . Gout   . Hyperlipemia   . Hypertension   . Kidney stones     Family History  Problem Relation Age of Onset  . Hypertension Mother   . Diabetes Mother   . Hyperlipidemia Mother   . Hypertension Father   . Hypertension Sister   . Hyperlipidemia Sister   . Heart disease Sister   . Stroke Brother   . Seizures Brother     History reviewed. No pertinent surgical history. Social History   Occupational History  . Retired    Social History Main Topics  . Smoking status: Never Smoker  . Smokeless tobacco: Never Used  . Alcohol use Yes     Comment: Rarely  . Drug use: No  . Sexual activity: Not on file

## 2017-03-08 ENCOUNTER — Encounter (INDEPENDENT_AMBULATORY_CARE_PROVIDER_SITE_OTHER): Payer: Self-pay | Admitting: Orthopedic Surgery

## 2017-03-08 ENCOUNTER — Telehealth: Payer: Self-pay | Admitting: Rheumatology

## 2017-03-08 NOTE — Telephone Encounter (Signed)
Patient calling to let you know he is ready to schedule MRI Lt Elbow. Patient now experiencing pain in the lt shoulder as well. Please call to schedule.

## 2017-03-08 NOTE — Telephone Encounter (Signed)
This is for Scott Graves he evaluated the patients elbow.

## 2017-03-08 NOTE — Telephone Encounter (Signed)
Didn't see note in chart for MRI

## 2017-03-12 ENCOUNTER — Emergency Department (HOSPITAL_COMMUNITY)
Admission: EM | Admit: 2017-03-12 | Discharge: 2017-03-12 | Disposition: A | Discharge status: Home / Self Care | Payer: Medicare Other | Attending: Emergency Medicine | Admitting: Emergency Medicine

## 2017-03-12 ENCOUNTER — Encounter (HOSPITAL_COMMUNITY): Payer: Self-pay | Admitting: Emergency Medicine

## 2017-03-12 DIAGNOSIS — R9431 Abnormal electrocardiogram [ECG] [EKG]: Secondary | ICD-10-CM

## 2017-03-12 DIAGNOSIS — E785 Hyperlipidemia, unspecified: Secondary | ICD-10-CM | POA: Diagnosis not present

## 2017-03-12 DIAGNOSIS — R110 Nausea: Secondary | ICD-10-CM

## 2017-03-12 DIAGNOSIS — R7309 Other abnormal glucose: Secondary | ICD-10-CM | POA: Diagnosis not present

## 2017-03-12 DIAGNOSIS — I10 Essential (primary) hypertension: Secondary | ICD-10-CM | POA: Diagnosis not present

## 2017-03-12 DIAGNOSIS — Z79899 Other long term (current) drug therapy: Secondary | ICD-10-CM | POA: Insufficient documentation

## 2017-03-12 DIAGNOSIS — I517 Cardiomegaly: Secondary | ICD-10-CM | POA: Diagnosis not present

## 2017-03-12 DIAGNOSIS — E876 Hypokalemia: Secondary | ICD-10-CM | POA: Diagnosis not present

## 2017-03-12 LAB — I-STAT TROPONIN, ED: TROPONIN I, POC: 0 ng/mL (ref 0.00–0.08)

## 2017-03-12 LAB — BASIC METABOLIC PANEL
Anion gap: 10 (ref 5–15)
BUN: 15 mg/dL (ref 6–20)
CALCIUM: 10.1 mg/dL (ref 8.9–10.3)
CO2: 23 mmol/L (ref 22–32)
CREATININE: 1.33 mg/dL — AB (ref 0.61–1.24)
Chloride: 104 mmol/L (ref 101–111)
GFR calc Af Amer: >60 mL/min (ref >60)
GFR, EST NON AFRICAN AMERICAN: 54 mL/min — AB (ref >60)
Glucose, Bld: 107 mg/dL — ABNORMAL HIGH (ref 65–99)
Potassium: 4.1 mmol/L (ref 3.5–5.1)
Sodium: 137 mmol/L (ref 135–145)

## 2017-03-12 LAB — CBC
HCT: 39.7 % (ref 39.0–52.0)
Hemoglobin: 12.5 g/dL — ABNORMAL LOW (ref 13.0–17.0)
MCH: 25.9 pg — AB (ref 26.0–34.0)
MCHC: 31.5 g/dL (ref 30.0–36.0)
MCV: 82.2 fL (ref 78.0–100.0)
PLATELETS: 231 10*3/uL (ref 150–400)
RBC: 4.83 MIL/uL (ref 4.22–5.81)
RDW: 13.5 % (ref 11.5–15.5)
WBC: 5.2 10*3/uL (ref 4.0–10.5)

## 2017-03-12 NOTE — ED Triage Notes (Signed)
Pt sent by PCP to follow up on abnormal EKG today on his office, pt denies any pain, SOB states he is been nauseated for the past 3 days.

## 2017-03-12 NOTE — ED Provider Notes (Signed)
[facility] DEPT Provider Note   CSN: [id number] Arrival date & time: 03/12/17  5852  By signing my name below, I, Neta Mends, attest that this documentation has been prepared under the direction and in the presence of Ripley Fraise, MD . Electronically Signed: Neta Mends, ED Scribe. 03/12/2017. 11:40 PM.    History   Chief Complaint Chief Complaint  Patient presents with  . Abnormal ECG    The history is provided by the patient. No language interpreter was used.   HPI Comments:  Scott Graves is a 66 y.o. male with PMHx of HTN who presents to the Emergency Department, here for follow up for an abnormal EKG today. Pt was seen by his PCP today and his EKG was normal, so he was referred to the ED for further evaluation. He reports nausea for the past 3 days, and reports associated headaches. He states that the nausea worsens when laying down. Pt has been previously seen by a cardiologist at home in [place] and he had an ultrasound with no abnormal findings. He took tylenol with mild relief for headaches. Denies vomiting, diarrhea, abdominal pain, cough, SOB, diaphoresis, fevers, chest pain.   No h/o CAD Past Medical History:  Diagnosis Date  . Arthritis   . Gout   . Hyperlipemia   . Hypertension   . Kidney stones     Patient Active Problem List   Diagnosis Date Noted  . Gout 09/19/2016  . Essential hypertension 09/19/2016  . Paresthesia 09/19/2016  . GERD (gastroesophageal reflux disease) 09/19/2016    History reviewed. No pertinent surgical history.     Home Medications    Prior to Admission medications   Medication Sig Start Date End Date Taking? Authorizing Provider  allopurinol (ZYLOPRIM) 300 MG tablet Take 300 mg by mouth daily.    Historical Provider, MD  MICARDIS HCT 40-12.5 MG tablet  05/05/16   Historical Provider, MD  omeprazole (PRILOSEC) 40 MG capsule  05/26/16   Historical Provider, MD  potassium chloride (K-DUR) 10 MEQ tablet Take 10  mEq by mouth daily.    Historical Provider, MD  pregabalin (LYRICA) 225 MG capsule Take 225 mg by mouth daily.    Historical Provider, MD  tolterodine (DETROL LA) 4 MG 24 hr capsule Take 4 mg by mouth daily.    Historical Provider, MD  ZETIA 10 MG tablet  05/05/16   Historical Provider, MD    Family History Family History  Problem Relation Age of Onset  . Hypertension Mother   . Diabetes Mother   . Hyperlipidemia Mother   . Hypertension Father   . Hypertension Sister   . Hyperlipidemia Sister   . Heart disease Sister   . Stroke Brother   . Seizures Brother     Social History Social History  Substance Use Topics  . Smoking status: Never Smoker  . Smokeless tobacco: Never Used  . Alcohol use Yes     Comment: Rarely     Allergies   Statins   Review of Systems Review of Systems  Constitutional: Negative for diaphoresis and fever.  Respiratory: Negative for cough and shortness of breath.   Gastrointestinal: Positive for nausea. Negative for abdominal pain, diarrhea and vomiting.  Neurological: Positive for headaches.  All other systems reviewed and are negative.    Physical Exam Updated Vital Signs BP 138/85 (BP Location: Left Arm)   Pulse 78   Temp 98.5 F (36.9 C) (Oral)   Resp 18   Ht 5\' 7"  (  1.702 m)   Wt 195 lb (88.5 kg)   SpO2 99%   BMI 30.54 kg/m   Physical Exam  Nursing note and vitals reviewed.  CONSTITUTIONAL: Well developed/well nourished HEAD: Normocephalic/atraumatic EYES: EOMI/PERRL ENMT: Mucous membranes moist NECK: supple no meningeal signs SPINE/BACK:entire spine nontender CV: S1/S2 noted, no murmurs/rubs/gallops noted LUNGS: Lungs are clear to auscultation bilaterally, no apparent distress ABDOMEN: soft, nontender, no rebound or guarding, bowel sounds noted throughout abdomen GU:no cva tenderness NEURO: Pt is awake/alert/appropriate, moves all extremitiesx4.  No facial droop.   EXTREMITIES: pulses normal/equal, full ROM SKIN: warm,  color normal PSYCH: no abnormalities of mood noted, alert and oriented to situation       ED Treatments / Results  DIAGNOSTIC STUDIES:  Oxygen Saturation is 99% on RA, normal by my interpretation.    COORDINATION OF CARE:  11:33 PM Discussed treatment plan with pt at bedside and pt agreed to plan.   Labs (all labs ordered are listed, but only abnormal results are displayed) Labs Reviewed  BASIC METABOLIC PANEL - Abnormal; Notable for the following:       Result Value   Glucose, Bld 107 (*)    Creatinine, Ser 1.33 (*)    GFR calc non Af Amer 54 (*)    All other components within normal limits  CBC - Abnormal; Notable for the following:    Hemoglobin 12.5 (*)    MCH 25.9 (*)    All other components within normal limits  I-STAT TROPOININ, ED    EKG  EKG Interpretation  Date/Time:  Monday March 12 2017 19:11:52 EDT Ventricular Rate:  77 PR Interval:  162 QRS Duration: 84 QT Interval:  364 QTC Calculation: 411 R Axis:   66 Text Interpretation:  Normal sinus rhythm Nonspecific ST abnormality Abnormal ECG No previous ECGs available Confirmed by Christy Gentles  MD, Elenore Rota ([id number]) on 03/12/2017 11:27:08 PM       Radiology No results found.  Procedures Procedures (including critical care time)  Medications Ordered in ED Medications - No data to display   Initial Impression / Assessment and Plan / ED Course  I have reviewed the triage vital signs and the nursing notes.  Pertinent labs   results that were available during my care of the patient were reviewed by me and considered in my medical decision making (see chart for details).     Pt well appearing No symptoms at this time EKG here and from PCP reviewed/similar I doubt ACS or other acute emergent condition He will f/u with his cardiologist that he has seen previously We discussed strict ER return precautions   Final Clinical Impressions(s) / ED Diagnoses   Final diagnoses:  Abnormal EKG  Nausea     New Prescriptions New Prescriptions   No medications on file  I personally performed the services described in this documentation, which was scribed in my presence. The recorded information has been reviewed and is accurate.        Ripley Fraise, MD 03/13/17 [phone]

## 2017-03-12 NOTE — Discharge Instructions (Signed)

## 2017-03-12 NOTE — Telephone Encounter (Signed)
Yes, OK to order MRI since injection did not last

## 2017-03-13 ENCOUNTER — Other Ambulatory Visit (INDEPENDENT_AMBULATORY_CARE_PROVIDER_SITE_OTHER): Payer: Self-pay

## 2017-03-13 DIAGNOSIS — M7712 Lateral epicondylitis, left elbow: Secondary | ICD-10-CM

## 2017-03-13 NOTE — Telephone Encounter (Signed)
done

## 2017-03-14 DIAGNOSIS — R000 Tachycardia, unspecified: Secondary | ICD-10-CM | POA: Diagnosis not present

## 2017-03-15 DIAGNOSIS — R3915 Urgency of urination: Secondary | ICD-10-CM | POA: Diagnosis not present

## 2017-03-15 DIAGNOSIS — N401 Enlarged prostate with lower urinary tract symptoms: Secondary | ICD-10-CM | POA: Diagnosis not present

## 2017-03-15 DIAGNOSIS — Z125 Encounter for screening for malignant neoplasm of prostate: Secondary | ICD-10-CM | POA: Diagnosis not present

## 2017-03-20 DIAGNOSIS — I119 Hypertensive heart disease without heart failure: Secondary | ICD-10-CM | POA: Diagnosis not present

## 2017-03-20 DIAGNOSIS — I10 Essential (primary) hypertension: Secondary | ICD-10-CM | POA: Diagnosis not present

## 2017-03-20 DIAGNOSIS — R9431 Abnormal electrocardiogram [ECG] [EKG]: Secondary | ICD-10-CM | POA: Diagnosis not present

## 2017-03-28 ENCOUNTER — Ambulatory Visit
Admission: RE | Admit: 2017-03-28 | Discharge: 2017-03-28 | Disposition: A | Discharge status: Home / Self Care | Payer: Medicare Other | Source: Ambulatory Visit | Attending: Orthopaedic Surgery | Admitting: Orthopaedic Surgery

## 2017-03-28 DIAGNOSIS — M7712 Lateral epicondylitis, left elbow: Secondary | ICD-10-CM

## 2017-03-28 DIAGNOSIS — S46312A Strain of muscle, fascia and tendon of triceps, left arm, initial encounter: Secondary | ICD-10-CM | POA: Diagnosis not present

## 2017-04-03 ENCOUNTER — Ambulatory Visit (INDEPENDENT_AMBULATORY_CARE_PROVIDER_SITE_OTHER): Payer: Medicare Other | Admitting: Orthopaedic Surgery

## 2017-04-03 ENCOUNTER — Encounter (INDEPENDENT_AMBULATORY_CARE_PROVIDER_SITE_OTHER): Payer: Self-pay | Admitting: Orthopaedic Surgery

## 2017-04-03 VITALS — BP 100/68 | HR 68 | Resp 14 | Ht 67.0 in | Wt 185.0 lb

## 2017-04-03 DIAGNOSIS — M7712 Lateral epicondylitis, left elbow: Secondary | ICD-10-CM

## 2017-04-03 NOTE — Progress Notes (Signed)
   Office Visit Note   Patient: Scott Graves           Date of Birth: 07-Jan-1951           MRN: [id number] Visit Date: 04/03/2017              Requested by: Lucianne Lei, [address] [place], [place] [address] PCP: Lucianne Lei, MD   Assessment & Plan: Visit Diagnoses:  1. Lateral epicondylitis, left elbow     Plan: Tennis elbow strap, Voltaren gel, tennis elbow exercises, ossicle repeat injections over time. Discussion regarding diagnosis and other treatment options including surgery.  Follow-Up Instructions: Return if symptoms worsen or fail to improve.   Orders:  No orders of the defined types were placed in this encounter.  No orders of the defined types were placed in this encounter.     Procedures: No procedures performed   Clinical Data: No additional findings.   Subjective: Chief Complaint  Patient presents with  . Left Elbow - Results    MRI results of Left elbow   Mr Heavner has had a chronic problem with the lateral aspect of his left elbow clinically consistent with lateral epicondylitis or tennis elbow. He had a cortisone injection which made a difference only to have it recur. I ordered an MRI scan which demonstrates mild tendinosis of the common extensor tendon origin with a small interstitial tear  HPI  Review of Systems   Objective: Vital Signs: BP 100/68   Pulse 68   Resp 14   Ht 5\' 7"  (1.702 m)   Wt 185 lb (83.9 kg)   BMI 28.98 kg/m   Physical Exam  Ortho Exam left elbow with full range of motion including flexion extension pronation and supination. Neurovascular exam intact. Local pain over the lateral lip condyle particularly with grip in extension versus flexion. No crepitation. Skin intact. No erythema or ecchymosis  Specialty Comments:  No specialty comments available.  Imaging: No results found.   PMFS History: Patient Active Problem List   Diagnosis Date Noted  . Gout 09/19/2016  . Essential hypertension 09/19/2016   . Paresthesia 09/19/2016  . GERD (gastroesophageal reflux disease) 09/19/2016   Past Medical History:  Diagnosis Date  . Arthritis   . Gout   . Hyperlipemia   . Hypertension   . Kidney stones     Family History  Problem Relation Age of Onset  . Hypertension Mother   . Diabetes Mother   . Hyperlipidemia Mother   . Hypertension Father   . Hypertension Sister   . Hyperlipidemia Sister   . Heart disease Sister   . Stroke Brother   . Seizures Brother     History reviewed. No pertinent surgical history. Social History   Occupational History  . Retired    Social History Main Topics  . Smoking status: Never Smoker  . Smokeless tobacco: Never Used  . Alcohol use Yes     Comment: Rarely  . Drug use: No  . Sexual activity: Not on file     Garald Balding, MD   Note - This record has been created using [employer].  Chart creation errors have been sought, but may not always  have been located. Such creation errors do not reflect on  the standard of medical care.

## 2017-06-15 DIAGNOSIS — G622 Polyneuropathy due to other toxic agents: Secondary | ICD-10-CM | POA: Diagnosis not present

## 2017-06-15 DIAGNOSIS — E876 Hypokalemia: Secondary | ICD-10-CM | POA: Diagnosis not present

## 2017-06-15 DIAGNOSIS — E7800 Pure hypercholesterolemia, unspecified: Secondary | ICD-10-CM | POA: Diagnosis not present

## 2017-06-15 DIAGNOSIS — R7309 Other abnormal glucose: Secondary | ICD-10-CM | POA: Diagnosis not present

## 2017-06-15 DIAGNOSIS — I10 Essential (primary) hypertension: Secondary | ICD-10-CM | POA: Diagnosis not present

## 2017-08-23 DIAGNOSIS — J069 Acute upper respiratory infection, unspecified: Secondary | ICD-10-CM | POA: Diagnosis not present

## 2017-09-07 DIAGNOSIS — Z23 Encounter for immunization: Secondary | ICD-10-CM | POA: Diagnosis not present

## 2017-09-13 HISTORY — PX: ELBOW BURSA SURGERY: SHX615

## 2017-09-15 DIAGNOSIS — M25021 Hemarthrosis, right elbow: Secondary | ICD-10-CM | POA: Diagnosis not present

## 2017-09-17 ENCOUNTER — Ambulatory Visit (INDEPENDENT_AMBULATORY_CARE_PROVIDER_SITE_OTHER): Payer: Medicare Other | Admitting: Orthopaedic Surgery

## 2017-09-17 ENCOUNTER — Encounter (INDEPENDENT_AMBULATORY_CARE_PROVIDER_SITE_OTHER): Payer: Self-pay | Admitting: Orthopaedic Surgery

## 2017-09-17 ENCOUNTER — Telehealth: Payer: Self-pay | Admitting: Neurology

## 2017-09-17 VITALS — BP 124/71 | HR 70 | Resp 14 | Ht 70.0 in | Wt 185.0 lb

## 2017-09-17 DIAGNOSIS — M25421 Effusion, right elbow: Secondary | ICD-10-CM | POA: Diagnosis not present

## 2017-09-17 MED ORDER — LIDOCAINE HCL (PF) 1 % IJ SOLN
1.0000 mL | INTRAMUSCULAR | Status: AC | PRN
  Administered 2017-09-17: 1 mL

## 2017-09-17 MED ORDER — METHYLPREDNISOLONE ACETATE 40 MG/ML IJ SUSP
40.0000 mg | INTRAMUSCULAR | Status: AC | PRN
  Administered 2017-09-17: 40 mg via INTRA_ARTICULAR

## 2017-09-17 NOTE — Progress Notes (Signed)
Office Visit Note   Patient: Antoni Stefan           Date of Birth: Sep 24, 1951           MRN: [id number] Visit Date: 09/17/2017              Requested by: Lucianne Lei, [address] [place], [place] [address] PCP: Lucianne Lei, MD   Assessment & Plan: Visit Diagnoses:  1. Effusion of right olecranon bursa     Plan: right olecranon bursitis. Aspirated 10 mL of yellow orange fluid. We'll send to the lab. Injected cortisone. Office 1 week has history of gout Follow-Up Instructions: Return in about 1 week (around 09/24/2017).   Orders:  No orders of the defined types were placed in this encounter.  No orders of the defined types were placed in this encounter.     Procedures: Medium Joint Inj: R olecranon bursa on 09/17/2017 3:40 PM Indications: pain and joint swelling Details: 22 G 1.5 in needle, posterior approach Medications: 1 mL lidocaine (PF) 1 %; 40 mg methylPREDNISolone acetate 40 MG/ML Aspirate: 10 mL yellow      Clinical Data: No additional findings.   Subjective: Chief Complaint  Patient presents with  . Right Elbow - Pain     Mr. Wenke is a 66 y o that presents with Right elbow pain.  He just noticed swelling at the R elbow. Friday am. No pain, numbness or tingling. No fevers, chills , may have bumped his elbow, not sure.  first noted onset of right olecranon swelling last week after an injury. Does have history of gout. Mass is consistent with olecranon bursitis but not uncomfortable.  HPI  Review of Systems  Constitutional: Negative for fatigue.  HENT: Negative for hearing loss.   Respiratory: Negative for apnea, chest tightness and shortness of breath.   Cardiovascular: Negative for chest pain, palpitations and leg swelling.  Gastrointestinal: Negative for blood in stool, constipation and diarrhea.  Genitourinary: Negative for difficulty urinating.  Musculoskeletal: Negative for arthralgias, back pain, joint swelling, myalgias, neck pain and  neck stiffness.  Neurological: Negative for weakness, numbness and headaches.  Hematological: Does not bruise/bleed easily.  Psychiatric/Behavioral: Positive for sleep disturbance. The patient is not nervous/anxious.      Objective: Vital Signs: BP 124/71   Pulse 70   Resp 14   Ht 5\' 10"  (1.778 m)   Wt 185 lb (83.9 kg)   BMI 26.54 kg/m   Physical Exam  Ortho Exam right olecranon bursitis. Bursa measures about 3 cm in diameter. Fluctuant without pain. No redness. Full range of motion of elbow. Neurovascular exam intact. Specialty Comments:  No specialty comments available.  Imaging: No results found.   PMFS History: Patient Active Problem List   Diagnosis Date Noted  . Gout 09/19/2016  . Essential hypertension 09/19/2016  . Paresthesia 09/19/2016  . GERD (gastroesophageal reflux disease) 09/19/2016   Past Medical History:  Diagnosis Date  . Arthritis   . Gout   . Hyperlipemia   . Hypertension   . Kidney stones     Family History  Problem Relation Age of Onset  . Hypertension Mother   . Diabetes Mother   . Hyperlipidemia Mother   . Hypertension Father   . Hypertension Sister   . Hyperlipidemia Sister   . Heart disease Sister   . Stroke Brother   . Seizures Brother     History reviewed. No pertinent surgical history. Social History   Occupational History  .  Occupation: Retired  Tobacco Use  . Smoking status: Never Smoker  . Smokeless tobacco: Never Used  Substance and Sexual Activity  . Alcohol use: Yes    Comment: Rarely  . Drug use: No  . Sexual activity: Not on file

## 2017-09-23 LAB — SYNOVIAL CELL COUNT + DIFF, W/ CRYSTALS
Basophils, %: 0 %
Eosinophils-Synovial: 0 % (ref 0–2)
LYMPHOCYTES-SYNOVIAL FLD: 9 % (ref 0–74)
MONOCYTE/MACROPHAGE: 85 % — AB (ref 0–69)
Neutrophil, Synovial: 3 % (ref 0–24)
SYNOVIOCYTES, %: 3 % (ref 0–15)
WBC, Synovial: 536 cells/uL — ABNORMAL HIGH (ref <150)

## 2017-09-23 LAB — ANAEROBIC AND AEROBIC CULTURE
AER RESULT: NO GROWTH
MICRO NUMBER:: 81239796
MICRO NUMBER:: 81239797
SPECIMEN QUALITY: ADEQUATE
SPECIMEN QUALITY:: ADEQUATE

## 2017-09-24 ENCOUNTER — Ambulatory Visit (INDEPENDENT_AMBULATORY_CARE_PROVIDER_SITE_OTHER): Payer: Medicare Other | Admitting: Orthopaedic Surgery

## 2017-09-24 ENCOUNTER — Encounter (INDEPENDENT_AMBULATORY_CARE_PROVIDER_SITE_OTHER): Payer: Self-pay | Admitting: Orthopaedic Surgery

## 2017-09-24 DIAGNOSIS — M7021 Olecranon bursitis, right elbow: Secondary | ICD-10-CM | POA: Diagnosis not present

## 2017-09-24 NOTE — Progress Notes (Signed)
   Office Visit Note   Patient: Scott Graves           Date of Birth: [birth year]           MRN: [id number] Visit Date: 09/24/2017              Requested by: Lucianne Lei, [address] [place], [place] [address] PCP: Lucianne Lei, MD   Assessment & Plan: Visit Diagnoses:  1. Olecranon bursitis, right elbow     Plan: Resolved right olecranon bursitis. No further treatment. Return as needed  Follow-Up Instructions: Return if symptoms worsen or fail to improve.   Orders:  No orders of the defined types were placed in this encounter.  No orders of the defined types were placed in this encounter.     Procedures: No procedures performed   Clinical Data: No additional findings.   Subjective: No chief complaint on file. Seen last week for aspiration of her right olecranon bursitis. Fluid was negative for infection or gout. I did inject cortisone. Presently not having any pain or any swelling about the elbow. denies numbness or tingling.  HPI  Review of Systems   Objective: Vital Signs: There were no vitals taken for this visit.  Physical Exam  Ortho Exam right elbow without any swelling or erythema or ecchymosis about the olecranon bursa. Bursa is completely flat. Full range of motion of elbow. Her vascular exam intact. No pain  Specialty Comments:  No specialty comments available.  Imaging: No results found.   PMFS History: Patient Active Problem List   Diagnosis Date Noted  . Olecranon bursitis, right elbow 09/24/2017  . Gout 09/19/2016  . Essential hypertension 09/19/2016  . Paresthesia 09/19/2016  . GERD (gastroesophageal reflux disease) 09/19/2016   Past Medical History:  Diagnosis Date  . Arthritis   . Gout   . Hyperlipemia   . Hypertension   . Kidney stones     Family History  Problem Relation Age of Onset  . Hypertension Mother   . Diabetes Mother   . Hyperlipidemia Mother   . Hypertension Father   . Hypertension Sister   .  Hyperlipidemia Sister   . Heart disease Sister   . Stroke Brother   . Seizures Brother     History reviewed. No pertinent surgical history. Social History   Occupational History  . Occupation: Retired  Tobacco Use  . Smoking status: Never Smoker  . Smokeless tobacco: Never Used  Substance and Sexual Activity  . Alcohol use: Yes    Comment: Rarely  . Drug use: No  . Sexual activity: Not on file     Garald Balding, MD   Note - This record has been created using [employer].  Chart creation errors have been sought, but may not always  have been located. Such creation errors do not reflect on  the standard of medical care.

## 2017-10-09 DIAGNOSIS — L853 Xerosis cutis: Secondary | ICD-10-CM | POA: Diagnosis not present

## 2017-10-09 DIAGNOSIS — L209 Atopic dermatitis, unspecified: Secondary | ICD-10-CM | POA: Diagnosis not present

## 2017-10-09 DIAGNOSIS — Z23 Encounter for immunization: Secondary | ICD-10-CM | POA: Diagnosis not present

## 2017-10-12 ENCOUNTER — Encounter: Payer: Self-pay | Admitting: Physician Assistant

## 2017-10-12 DIAGNOSIS — E785 Hyperlipidemia, unspecified: Secondary | ICD-10-CM | POA: Diagnosis not present

## 2017-10-12 DIAGNOSIS — I10 Essential (primary) hypertension: Secondary | ICD-10-CM | POA: Diagnosis not present

## 2017-10-12 DIAGNOSIS — R7309 Other abnormal glucose: Secondary | ICD-10-CM | POA: Diagnosis not present

## 2017-10-16 NOTE — Telephone Encounter (Signed)
I called pt, he is on the wait list for Dr. Rexene Alberts. I called to offer him sooner appts for tomorrow but no answer, and VM is full. Unable to leave a message.

## 2017-10-19 ENCOUNTER — Encounter: Payer: Self-pay | Admitting: Gastroenterology

## 2017-10-20 DIAGNOSIS — G622 Polyneuropathy due to other toxic agents: Secondary | ICD-10-CM | POA: Diagnosis not present

## 2017-10-20 DIAGNOSIS — D649 Anemia, unspecified: Secondary | ICD-10-CM | POA: Diagnosis not present

## 2017-10-20 DIAGNOSIS — R7309 Other abnormal glucose: Secondary | ICD-10-CM | POA: Diagnosis not present

## 2017-10-20 DIAGNOSIS — E785 Hyperlipidemia, unspecified: Secondary | ICD-10-CM | POA: Diagnosis not present

## 2017-10-23 ENCOUNTER — Ambulatory Visit: Payer: Medicare Other | Admitting: Physician Assistant

## 2017-10-30 DIAGNOSIS — D649 Anemia, unspecified: Secondary | ICD-10-CM | POA: Diagnosis not present

## 2017-10-30 DIAGNOSIS — R12 Heartburn: Secondary | ICD-10-CM | POA: Diagnosis not present

## 2017-10-30 DIAGNOSIS — R1310 Dysphagia, unspecified: Secondary | ICD-10-CM | POA: Diagnosis not present

## 2017-10-31 DIAGNOSIS — D649 Anemia, unspecified: Secondary | ICD-10-CM | POA: Diagnosis not present

## 2017-11-22 ENCOUNTER — Encounter (INDEPENDENT_AMBULATORY_CARE_PROVIDER_SITE_OTHER): Payer: Self-pay | Admitting: Orthopaedic Surgery

## 2017-11-22 ENCOUNTER — Ambulatory Visit (INDEPENDENT_AMBULATORY_CARE_PROVIDER_SITE_OTHER): Payer: Medicare Other | Admitting: Neurology

## 2017-11-22 ENCOUNTER — Encounter: Payer: Self-pay | Admitting: Neurology

## 2017-11-22 VITALS — BP 138/79 | HR 92 | Ht 67.0 in | Wt 184.0 lb

## 2017-11-22 DIAGNOSIS — R202 Paresthesia of skin: Secondary | ICD-10-CM | POA: Diagnosis not present

## 2017-11-22 NOTE — Progress Notes (Signed)
Subjective:    Patient ID: Scott Graves is a 67 y.o. male.  HPI:     Interim history:   [name] is a 67 year old right-handed gentleman with an underlying medical history of heart disease, hypertension, hyperlipidemia, reflux disease, gout, and overweight state, who presents for follow-up consultation of his paresthesias. The patient is unaccompanied today. I last saw him on 09/12/2016, at which time he reported that he was started on Lyrica by his primary care physician with some symptomatic improvement of his paresthesias. He had no new symptoms. We talked about his test results at the time. His exam was stable. I suggested as needed follow-up.  Today, 11/22/2017: He reports unchanged symptoms of tingling no significant pain, he stopped taking the Lyrica as he did not feel any different on it. He has not had any one-sided weakness or numbness permanently. He has more pronounced symptoms with tingling when he sits for prolonged period of time or when lying in bed at night. He does not endorse restless legs or having to move. He does not believe symptoms have progressed.   Previously:  I first met him on 06/08/2016 at the request of his primary care physician, at which time he reported pins and needles like sensation in his feet, mostly at night, with symptoms that started about a month prior. His examination was fairly benign. I proceeded with further workup in the form of lab work and EMG and nerve conduction testing.    He had labs on 06/15/2016 which showed normal TSH, unremarkable RPR, normal multiple myeloma panel, unremarkable heavy metals and blood and negative Lyme antibodies.   He had EMG and nerve conduction testing of both lower extremities on 07/14/2016 and I reviewed the results: IMPRESSION:    This is a normal study, there is no electrodiagnostic evidence of right upper or lower extremity neuropathy, there is no evidence of right cervical or right lumbosacral radiculopathy. We  called him with his test results.      06/08/2016: He reports pins and needles like sensation in his feet, mostly at night, with symptoms starting about a month ago. He denies any trauma, no falls, no injuries, no weakness, maybe symptoms have plateaued. Symptoms started 1 night as he was in bed with a prick that he felt in his left big toe on the bottom, felt like something had stung him or bitten him, in fact, he searched the bed and bedroom for bugs. He does state that he has had problems with bugs in or around the house and he was spraying pesticide about 3-4 weeks ago and he also had the exterminator come in for treatment in or around the house. Since then, he has intermittent sensation of a prickly feeling in both feet, sometimes in his hands, symptoms seem to be intermittent, no constant numbness, no actual pain but he does have an uncomfortable prickly sensation first and then tingling. He reports no history of diabetes. He has no family history of neuropathy. One brother passed away after stroke. He is the middle child of 9 children. One brother died at age 22 with diabetes and history of alcoholism. Another brother passed away at age 44 with HIV. Another brother died at age 54 with arthritis and history of COPD. He has a brother age 62 who has hypertension and history of stroke. Mother lived to be 59 years old, father lived to be 65 years old. He has a sister age 64 with hypertension, another younger sister, age 59, with breast  cancer and a younger sister age 41. He lives with his wife. He has 1 biological daughter, 2 stepdaughters. He drinks alcohol very occasionally, maybe twice a month, never heavy drinker. Lifelong nonsmoker. He was in the [employer] for 29 years, in [place], [place] and [place]. He was checked for HIV in 2011 at the [place] he says, results negative per patient. He had blood work in your office and he says he was told blood work was fine. We will request test results from your  office. I reviewed your office note from 06/06/2016, which you kindly included.  His Past Medical History Is Significant For: Past Medical History:  Diagnosis Date  . Arthritis   . Gout   . Hyperlipemia   . Hypertension   . Kidney stones    His Past Surgical History Is Significant For: No past surgical history on file.  His Family History Is Significant For: Family History  Problem Relation Age of Onset  . Hypertension Mother   . Diabetes Mother   . Hyperlipidemia Mother   . Hypertension Father   . Hypertension Sister   . Hyperlipidemia Sister   . Heart disease Sister   . Stroke Brother   . Seizures Brother    His Social History Is Significant For: Social History   Socioeconomic History  . Marital status: Married    Spouse name: None  . Number of children: 1  . Years of education: HS  . Highest education level: None  Social Needs  . Financial resource strain: None  . Food insecurity - worry: None  . Food insecurity - inability: None  . Transportation needs - medical: None  . Transportation needs - non-medical: None  Occupational History  . Occupation: Retired  Tobacco Use  . Smoking status: Never Smoker  . Smokeless tobacco: Never Used  Substance and Sexual Activity  . Alcohol use: Yes    Comment: Rarely  . Drug use: No  . Sexual activity: None  Other Topics Concern  . None  Social History Narrative   Rare caffeine use   Denies abuse and feels safe at home.     His Allergies Are:  Allergies  Allergen Reactions  . Statins   :   His Current Medications Are:  Outpatient Encounter Medications as of 11/22/2017  Medication Sig  . allopurinol (ZYLOPRIM) 300 MG tablet Take 300 mg by mouth daily.  Marland Kitchen MICARDIS HCT 40-12.5 MG tablet   . omeprazole (PRILOSEC) 40 MG capsule   . potassium chloride (K-DUR) 10 MEQ tablet Take 10 mEq by mouth daily.  . pregabalin (LYRICA) 225 MG capsule Take 225 mg by mouth daily.  Marland Kitchen tolterodine (DETROL LA) 4 MG 24 hr capsule  Take 4 mg by mouth daily.  Marland Kitchen ZETIA 10 MG tablet    No facility-administered encounter medications on file as of 11/22/2017.   :  Review of Systems:  Out of a complete 14 point review of systems, all are reviewed and negative with the exception of these symptoms as listed below: Review of Systems  Neurological:       Pt presents today to follow up on his tingling in his feet. Pt has stopped his lyrica for the past few weeks because he does not think it has helped.    Objective:  Neurological Exam  Physical Exam Physical Examination:   Vitals:   11/22/17 1306  BP: 138/79  Pulse: 92   General Examination: The patient is a very pleasant 67 y.o. male in no acute  distress. He appears well-developed and well-nourished and well groomed.   HEENT: Normocephalic, atraumatic, pupils are equal, round and reactive to light and accommodation. Extraocular tracking is good without limitation to gaze excursion or nystagmus noted. Normal smooth pursuit is noted. Mild bilateral cataracts are noted, appear stable.  prescription eyeglasses in place. Hearing is grossly intact. Face is symmetric with normal facial animation and normal facial sensation. Speech is clear with no dysarthria noted. There is no hypophonia. There is no lip, neck/head, jaw or voice tremor. Neck is supple with full range of passive and active motion. There are no carotid bruits on auscultation. Oropharynx exam reveals: mild to moderate mouth dryness, adequate dental hygiene and mild airway crowding. Tongue protrudes centrally and palate elevates symmetrically.   Chest: Clear to auscultation without wheezing, rhonchi or crackles noted.  Heart: S1+S2+0, regular and normal without murmurs, rubs or gallops noted.   Abdomen: Soft, non-tender and non-distended with normal bowel sounds appreciated on auscultation.  Extremities: There is no pitting edema in the distal lower extremities bilaterally. Pedal pulses are intact.  Skin:  Warm and dry without trophic changes noted. There are no varicose veins.  Musculoskeletal: exam reveals no obvious joint deformities, tenderness or joint swelling or erythema.   Neurologically:  Mental status: The patient is awake, alert and oriented in all 4 spheres. His immediate and remote memory, attention, language skills and fund of knowledge are appropriate. There is no evidence of aphasia, agnosia, apraxia or anomia. Speech is clear with normal prosody and enunciation. Thought process is linear. Mood is normal and affect is normal.  Cranial nerves II - XII are as described above under HEENT exam. In addition: shoulder shrug is normal with equal shoulder height noted. Motor exam: Normal bulk, strength and tone is noted. There is no drift, tremor or rebound. Romberg is negative. Reflexes are 2+ throughout, including ankles. Fine motor skills with finger taps, hand movements, rapid alternating patting, foot taps and foot agility are all normal.  Cerebellar testing: No dysmetria or intention tremor on finger to nose testing. Heel to shin is unremarkable bilaterally. There is no truncal or gait ataxia.  Sensory exam: intact to light touch, pinprick, vibration, temperature sense and in the upper and lower extremities all.  Gait, station and balance: He stands easily. No veering to one side is noted. No leaning to one side is noted. Posture is age-appropriate and stance is narrow based. Gait shows normal stride length and normal pace. No problems turning are noted. Tandem walk is unremarkable.   Assessment and Plan:   In summary, Cliff Rossbach is a very pleasant 67 year old male  with an underlying medical history of heart disease, hypertension, hyperlipidemia, reflux disease, gout, and overweight state, who presents for follow-up consultation of his paresthesias in the upper and lower extremities. His exam continues to be benign. Workup in 2017 included blood work in August 2017 and EMG and nerve  conduction testing in September 2017 with normal results as well. For symptomatic relief he had tried Lyrica through his primary care physician but stopped it as it was not helping. At this point, I suggested a second opinion with one of our neuromuscular specialist to see if any further workup is warranted or a nerve biopsy. I answered all his questions today and he was in agreement.  I spent 20 minutes in total face-to-face time with the patient, more than 50% of which was spent in counseling and coordination of care, reviewing test results, reviewing medication and discussing  or reviewing the diagnosis of paresthesia, its prognosis and treatment options. Pertinent laboratory and imaging test results that were available during this visit with the patient were reviewed by me and considered in my medical decision making (see chart for details).

## 2017-11-22 NOTE — Patient Instructions (Addendum)
As discussed, we will seek a second opinion with one of our neuromuscular specialists in the practice.   We will make an appointment for this and await recommendations.   Your exam looks good and work up from my end in 2017 with blood work and nerve testing was benign.

## 2017-11-26 ENCOUNTER — Ambulatory Visit (INDEPENDENT_AMBULATORY_CARE_PROVIDER_SITE_OTHER): Payer: Medicare Other | Admitting: Orthopaedic Surgery

## 2017-11-26 ENCOUNTER — Other Ambulatory Visit (INDEPENDENT_AMBULATORY_CARE_PROVIDER_SITE_OTHER): Payer: Self-pay

## 2017-11-26 ENCOUNTER — Other Ambulatory Visit (INDEPENDENT_AMBULATORY_CARE_PROVIDER_SITE_OTHER): Payer: Self-pay | Admitting: *Deleted

## 2017-11-26 ENCOUNTER — Encounter (INDEPENDENT_AMBULATORY_CARE_PROVIDER_SITE_OTHER): Payer: Self-pay | Admitting: Orthopaedic Surgery

## 2017-11-26 VITALS — BP 120/72 | HR 83 | Resp 14 | Ht 69.0 in | Wt 175.0 lb

## 2017-11-26 DIAGNOSIS — M25521 Pain in right elbow: Secondary | ICD-10-CM | POA: Diagnosis not present

## 2017-11-26 DIAGNOSIS — M7021 Olecranon bursitis, right elbow: Secondary | ICD-10-CM

## 2017-11-26 MED ORDER — METHYLPREDNISOLONE ACETATE 40 MG/ML IJ SUSP
20.0000 mg | INTRAMUSCULAR | Status: AC | PRN
  Administered 2017-11-26: 20 mg

## 2017-11-26 MED ORDER — LIDOCAINE HCL (PF) 1 % IJ SOLN
0.5000 mL | INTRAMUSCULAR | Status: AC | PRN
  Administered 2017-11-26: .5 mL

## 2017-11-26 NOTE — Progress Notes (Signed)
Office Visit Note   Patient: Scott Graves           Date of Birth: 01-04-1951           MRN: [id number] Visit Date: 11/26/2017              Requested by: Lucianne Lei, [address] [place], [place] [address] PCP: Lucianne Lei, MD   Assessment & Plan: Visit Diagnoses:  1. Olecranon bursitis, right elbow     Plan: Recurrent olecranon bursitis without fever or chills or erythema. We aspirated 8 mL of clear yellow urine fluid. We'll send to lab. Injected Xylocaine and Depo-Medrol. Long discussion regarding treatment options including simple monitoring versus excision. No evidence of infection or gout in prior lab values. We'll see back as needed  Follow-Up Instructions: Return if symptoms worsen or fail to improve.   Orders:  Orders Placed This Encounter  Procedures  . olecranon bursitis   No orders of the defined types were placed in this encounter.     Procedures: olecranon bursitis for (Olecranon bursitis) on 11/26/2017 3:21 PM Details: lateral approach Medications: 0.5 mL lidocaine (PF) 1 %; 20 mg methylPREDNISolone acetate 40 MG/ML Aspirate: 10 mL yellow and clear Consent was given by the patient.       Clinical Data: No additional findings.   Subjective: Chief Complaint  Patient presents with  . Right Elbow - Pain, Edema    Scott Graves is a 67 y o here today for the right olecranon bursitis.  He said the swelling returned 11/22/17. He had an appt with neurologist  Scott Graves was seen in November for a right olecranon bursitis. Fluid aspirate was negative for infection or gout. Cortisone injected and he did well aligned to have it recur. No fever chills or pain.  HPI  Review of Systems  Constitutional: Negative for fatigue.  HENT: Negative for hearing loss.   Respiratory: Negative for apnea, chest tightness and shortness of breath.   Cardiovascular: Negative for chest pain, palpitations and leg swelling.  Gastrointestinal: Negative for blood in stool,  constipation and diarrhea.  Genitourinary: Negative for difficulty urinating.  Musculoskeletal: Positive for back pain. Negative for arthralgias, joint swelling, myalgias, neck pain and neck stiffness.  Neurological: Positive for headaches. Negative for weakness and numbness.  Hematological: Does not bruise/bleed easily.  Psychiatric/Behavioral: Negative for sleep disturbance. The patient is not nervous/anxious.      Objective: Vital Signs: BP 120/72   Pulse 83   Resp 14   Ht 5\' 9"  (1.753 m)   Wt 175 lb (79.4 kg)   BMI 25.84 kg/m   Physical Exam  Ortho Exam awake alert and oriented 3 comfortable sitting. Olecranon bursitis with fluid. No pain or erythema or ecchymosis. Range of motion of elbow. Neurovascular exam intact. Bursitis measures approximately 3 cm in diameter  Specialty Comments:  No specialty comments available.  Imaging: No results found.   PMFS History: Patient Active Problem List   Diagnosis Date Noted  . Olecranon bursitis, right elbow 09/24/2017  . Gout 09/19/2016  . Essential hypertension 09/19/2016  . Paresthesia 09/19/2016  . GERD (gastroesophageal reflux disease) 09/19/2016   Past Medical History:  Diagnosis Date  . Arthritis   . Gout   . Hyperlipemia   . Hypertension   . Kidney stones     Family History  Problem Relation Age of Onset  . Hypertension Mother   . Diabetes Mother   . Hyperlipidemia Mother   . Hypertension Father   .  Hypertension Sister   . Hyperlipidemia Sister   . Heart disease Sister   . Stroke Brother   . Seizures Brother     History reviewed. No pertinent surgical history. Social History   Occupational History  . Occupation: Retired  Tobacco Use  . Smoking status: Never Smoker  . Smokeless tobacco: Never Used  Substance and Sexual Activity  . Alcohol use: Yes    Comment: Rarely  . Drug use: No  . Sexual activity: Not on file

## 2017-11-27 LAB — SYNOVIAL CELL COUNT + DIFF, W/ CRYSTALS
Basophils, %: 0 %
Eosinophils-Synovial: 0 % (ref 0–2)
LYMPHOCYTES-SYNOVIAL FLD: 57 % (ref 0–74)
MONOCYTE/MACROPHAGE: 41 % (ref 0–69)
Neutrophil, Synovial: 2 % (ref 0–24)
SYNOVIOCYTES, %: 0 % (ref 0–15)
WBC, Synovial: 1494 cells/uL — ABNORMAL HIGH (ref <150)

## 2017-11-27 LAB — GRAM STAIN
MICRO NUMBER:: 90059003
SPECIMEN QUALITY: ADEQUATE

## 2017-11-29 DIAGNOSIS — D2362 Other benign neoplasm of skin of left upper limb, including shoulder: Secondary | ICD-10-CM | POA: Diagnosis not present

## 2017-11-29 DIAGNOSIS — L821 Other seborrheic keratosis: Secondary | ICD-10-CM | POA: Diagnosis not present

## 2017-12-02 LAB — ANAEROBIC AND AEROBIC CULTURE
AER RESULT:: NO GROWTH
MICRO NUMBER: 90058628
MICRO NUMBER:: 90058637
SPECIMEN QUALITY: ADEQUATE
SPECIMEN QUALITY: ADEQUATE

## 2017-12-02 LAB — ANAEROBIC CULTURE
MICRO NUMBER:: 90058646
SPECIMEN QUALITY:: ADEQUATE

## 2017-12-06 ENCOUNTER — Encounter: Payer: Self-pay | Admitting: Gastroenterology

## 2017-12-06 ENCOUNTER — Other Ambulatory Visit (INDEPENDENT_AMBULATORY_CARE_PROVIDER_SITE_OTHER): Payer: Medicare Other

## 2017-12-06 ENCOUNTER — Ambulatory Visit (INDEPENDENT_AMBULATORY_CARE_PROVIDER_SITE_OTHER): Payer: Medicare Other | Admitting: Gastroenterology

## 2017-12-06 VITALS — BP 108/62 | HR 72 | Ht 67.0 in | Wt 182.0 lb

## 2017-12-06 DIAGNOSIS — D638 Anemia in other chronic diseases classified elsewhere: Secondary | ICD-10-CM | POA: Diagnosis not present

## 2017-12-06 DIAGNOSIS — R1319 Other dysphagia: Secondary | ICD-10-CM

## 2017-12-06 DIAGNOSIS — D539 Nutritional anemia, unspecified: Secondary | ICD-10-CM

## 2017-12-06 DIAGNOSIS — D508 Other iron deficiency anemias: Secondary | ICD-10-CM

## 2017-12-06 DIAGNOSIS — R195 Other fecal abnormalities: Secondary | ICD-10-CM

## 2017-12-06 LAB — BASIC METABOLIC PANEL
BUN: 22 mg/dL (ref 6–23)
CHLORIDE: 100 meq/L (ref 96–112)
CO2: 27 meq/L (ref 19–32)
CREATININE: 1.23 mg/dL (ref 0.40–1.50)
Calcium: 10.1 mg/dL (ref 8.4–10.5)
GFR: 75.43 mL/min (ref >60.00)
GLUCOSE: 110 mg/dL — AB (ref 70–99)
Potassium: 4 mEq/L (ref 3.5–5.1)
Sodium: 136 mEq/L (ref 135–145)

## 2017-12-06 LAB — CBC WITH DIFFERENTIAL/PLATELET
BASOS PCT: 1.6 % (ref 0.0–3.0)
Basophils Absolute: 0.1 10*3/uL (ref 0.0–0.1)
EOS PCT: 4.1 % (ref 0.0–5.0)
Eosinophils Absolute: 0.2 10*3/uL (ref 0.0–0.7)
HCT: 39.7 % (ref 39.0–52.0)
Hemoglobin: 13.1 g/dL (ref 13.0–17.0)
Lymphocytes Relative: 35.7 % (ref 12.0–46.0)
Lymphs Abs: 1.6 10*3/uL (ref 0.7–4.0)
MCHC: 33 g/dL (ref 30.0–36.0)
MCV: 81.4 fl (ref 78.0–100.0)
MONO ABS: 0.5 10*3/uL (ref 0.1–1.0)
Monocytes Relative: 10.8 % (ref 3.0–12.0)
NEUTROS ABS: 2.1 10*3/uL (ref 1.4–7.7)
Neutrophils Relative %: 47.8 % (ref 43.0–77.0)
PLATELETS: 253 10*3/uL (ref 150.0–400.0)
RBC: 4.88 Mil/uL (ref 4.22–5.81)
RDW: 13 % (ref 11.5–15.5)
WBC: 4.4 10*3/uL (ref 4.0–10.5)

## 2017-12-06 LAB — VITAMIN B12: Vitamin B-12: 463 pg/mL (ref 211–911)

## 2017-12-06 LAB — FOLATE: Folate: 19.3 ng/mL (ref >5.9)

## 2017-12-06 NOTE — Progress Notes (Signed)
Scott Graves    [id number]    October 10, 1951  Primary Care Physician:Bland, Myra Rude, MD  Referring Physician: Lucianne Lei, MD [address] [place], [place] [address]  Chief complaint: Dark stool, anemia  HPI: 67 year old here with complaints of dark stool 2 months ago, he was taking Pepto-Bismol at bedtime for dyspepsia.  Once he stopped taking Pepto-Bismol stool returned to normal color.  Patient is also worried about anemia.  His hemoglobin was slightly lower at 12.5 when he had labs in November through his primary care physician's office. Patient complaining of occasional dysphagia with liquids, worse with soda.  Feels gets hung up and he cant breathe. No trouble with food or pills.  Denies any odynophagia, nausea abdominal pain or bright red blood per rectum.  No recent change in bowel habits.  He has had multiple EGDs and colonoscopies in [place] and [place].  Review of records brought in by patient:  Hgb  12.5, Ferritin 453, Iron saturation 57%  Barium esophagogram was normal 2012  Colonoscopy 01/2003: Small transverse colon polyp adenomatous polyp  Colonoscopy 2009 : mild internal hemorrhoids  Colonoscopy 2012: Four sessile polyps removed from descending, transverse and ascending colon   Colonoscopy 2016: Dimunitive  Polyp removed from ascending colon, tubular adenoma  EGD 2012: Small hiatal hernia with schatzki's ring, no dilation  Swallow study 2016 : mild pharyngeal dysphagia   Outpatient Encounter Medications as of 12/06/2017  Medication Sig  . allopurinol (ZYLOPRIM) 300 MG tablet Take 300 mg by mouth daily.  Marland Kitchen MICARDIS HCT 40-12.5 MG tablet   . omeprazole (PRILOSEC) 40 MG capsule   . potassium chloride (K-DUR) 10 MEQ tablet Take 10 mEq by mouth daily.  . pregabalin (LYRICA) 225 MG capsule Take 225 mg by mouth daily.  Marland Kitchen tolterodine (DETROL LA) 4 MG 24 hr capsule Take 4 mg by mouth daily.  Marland Kitchen ZETIA 10 MG tablet    No facility-administered  encounter medications on file as of 12/06/2017.     Allergies as of 12/06/2017 - Review Complete 11/26/2017  Allergen Reaction Noted  . Statins  06/08/2016    Past Medical History:  Diagnosis Date  . Arthritis   . Gout   . Hyperlipemia   . Hypertension   . Kidney stones     No past surgical history on file.  Family History  Problem Relation Age of Onset  . Hypertension Mother   . Diabetes Mother   . Hyperlipidemia Mother   . Hypertension Father   . Hypertension Sister   . Hyperlipidemia Sister   . Heart disease Sister   . Stroke Brother   . Seizures Brother     Social History   Socioeconomic History  . Marital status: Married    Spouse name: Not on file  . Number of children: 1  . Years of education: HS  . Highest education level: Not on file  Social Needs  . Financial resource strain: Not on file  . Food insecurity - worry: Not on file  . Food insecurity - inability: Not on file  . Transportation needs - medical: Not on file  . Transportation needs - non-medical: Not on file  Occupational History  . Occupation: Retired  Tobacco Use  . Smoking status: Never Smoker  . Smokeless tobacco: Never Used  Substance and Sexual Activity  . Alcohol use: Yes    Comment: Rarely  . Drug use: No  . Sexual activity: Not on  file  Other Topics Concern  . Not on file  Social History Narrative   Rare caffeine use   Denies abuse and feels safe at home.       Review of systems: Review of Systems  Constitutional: Negative for fever and chills.  HENT: Negative.   Eyes: Negative for blurred vision.  Respiratory: Negative for cough, shortness of breath and wheezing.   Cardiovascular: Negative for chest pain and palpitations.  Gastrointestinal: as per HPI Genitourinary: Negative for dysuria, urgency, frequency and hematuria.  Musculoskeletal: Negative for myalgias, back pain and joint pain.  Skin: Negative for itching and rash.  Neurological: Negative for dizziness,  tremors, focal weakness, seizures and loss of consciousness.  Endo/Heme/Allergies: Positive for seasonal allergies.  Psychiatric/Behavioral: Negative for depression, suicidal ideas and hallucinations.  All other systems reviewed and are negative.   Physical Exam: Vitals:   12/06/17 0920  BP: 108/62  Pulse: 72   Body mass index is 28.51 kg/m. Gen:      No acute distress HEENT:  EOMI, sclera anicteric Neck:     No masses; no thyromegaly Lungs:    Clear to auscultation bilaterally; normal respiratory effort CV:         Regular rate and rhythm; no murmurs Abd:      + bowel sounds; soft, non-tender; no palpable masses, no distension Ext:    No edema; adequate peripheral perfusion Skin:      Warm and dry; no rash Neuro: alert and oriented x 3 Psych: normal mood and affect  Data Reviewed:  Reviewed labs, radiology imaging, old records and pertinent past GI work up   Assessment and Plan/Recommendations:  67 year old male here with complaints of dysphagia and mild anemia He had an episode of dark stool in November but that self resolved after he stopped taking Pepto-Bismol Check CBC, BMP, folate and B12 level Iron studies and ferritin were within normal limits based on recent labs from November 2018 Check fecal heme occult, if positive consider small bowel video capsule to further evaluate Obtain barium esophagram tablet to evaluate intermittent liquid dysphagia, he did have a mild pharyngeal dysphagia swallow study done in [place] 2 years ago was also noted to have a Schatzki's ring  Due for surveillance colonoscopy 2021 for colorectal cancer with history of adenoma   K. Denzil Magnuson , MD [phone] Mon-Fri 8a-5p [phone] after 5p, weekends, holidays  CC: Lucianne Lei, MD

## 2017-12-06 NOTE — Patient Instructions (Signed)
Go to the basement for labs today  You have been scheduled for a Barium Esophogram at [facility] (1st floor of the hospital) on 12/18/2017 at 10:30am. Please arrive 15 minutes prior to your appointment for registration. Make certain not to have anything to eat or drink 6 hours prior to your test. If you need to reschedule for any reason, please contact radiology at [phone] to do so. __________________________________________________________________ A barium swallow is an examination that concentrates on views of the esophagus. This tends to be a double contrast exam (barium and two liquids which, when combined, create a gas to distend the wall of the oesophagus) or single contrast (non-ionic iodine based). The study is usually tailored to your symptoms so a good history is essential. Attention is paid during the study to the form, structure and configuration of the esophagus, looking for functional disorders (such as aspiration, dysphagia, achalasia, motility and reflux) EXAMINATION You may be asked to change into a gown, depending on the type of swallow being performed. A radiologist and radiographer will perform the procedure. The radiologist will advise you of the type of contrast selected for your procedure and direct you during the exam. You will be asked to stand, sit or lie in several different positions and to hold a small amount of fluid in your mouth before being asked to swallow while the imaging is performed .In some instances you may be asked to swallow barium coated marshmallows to assess the motility of a solid food bolus. The exam can be recorded as a digital or video fluoroscopy procedure. POST PROCEDURE It will take 1-2 days for the barium to pass through your system. To facilitate this, it is important, unless otherwise directed, to increase your fluids for the next 24-48hrs and to resume your normal diet.  This test typically takes about 30 minutes to  perform. __________________________________________________________________________________

## 2017-12-12 DIAGNOSIS — H10413 Chronic giant papillary conjunctivitis, bilateral: Secondary | ICD-10-CM | POA: Diagnosis not present

## 2017-12-12 DIAGNOSIS — H40053 Ocular hypertension, bilateral: Secondary | ICD-10-CM | POA: Diagnosis not present

## 2017-12-12 DIAGNOSIS — H25813 Combined forms of age-related cataract, bilateral: Secondary | ICD-10-CM | POA: Diagnosis not present

## 2017-12-12 DIAGNOSIS — H35033 Hypertensive retinopathy, bilateral: Secondary | ICD-10-CM | POA: Diagnosis not present

## 2017-12-13 ENCOUNTER — Other Ambulatory Visit: Payer: Self-pay | Admitting: Emergency Medicine

## 2017-12-13 ENCOUNTER — Other Ambulatory Visit (INDEPENDENT_AMBULATORY_CARE_PROVIDER_SITE_OTHER): Payer: Medicare Other

## 2017-12-13 DIAGNOSIS — D649 Anemia, unspecified: Secondary | ICD-10-CM | POA: Diagnosis not present

## 2017-12-14 LAB — HEMOCCULT SLIDES (X 3 CARDS)
FECAL OCCULT BLD: NEGATIVE
OCCULT 1: NEGATIVE
OCCULT 2: NEGATIVE
OCCULT 3: NEGATIVE
OCCULT 4: NEGATIVE
OCCULT 5: NEGATIVE

## 2017-12-18 ENCOUNTER — Ambulatory Visit (HOSPITAL_COMMUNITY)
Admission: RE | Admit: 2017-12-18 | Discharge: 2017-12-18 | Disposition: A | Discharge status: Home / Self Care | Payer: Medicare Other | Source: Ambulatory Visit | Attending: Gastroenterology | Admitting: Gastroenterology

## 2017-12-18 DIAGNOSIS — R1310 Dysphagia, unspecified: Secondary | ICD-10-CM | POA: Diagnosis not present

## 2017-12-18 DIAGNOSIS — R1319 Other dysphagia: Secondary | ICD-10-CM | POA: Insufficient documentation

## 2018-01-02 ENCOUNTER — Encounter: Payer: Self-pay | Admitting: Neurology

## 2018-01-02 ENCOUNTER — Ambulatory Visit (INDEPENDENT_AMBULATORY_CARE_PROVIDER_SITE_OTHER): Payer: Medicare Other | Admitting: Neurology

## 2018-01-02 VITALS — BP 118/72 | HR 94 | Ht 67.0 in | Wt 180.5 lb

## 2018-01-02 DIAGNOSIS — R799 Abnormal finding of blood chemistry, unspecified: Secondary | ICD-10-CM | POA: Diagnosis not present

## 2018-01-02 DIAGNOSIS — E559 Vitamin D deficiency, unspecified: Secondary | ICD-10-CM | POA: Diagnosis not present

## 2018-01-02 DIAGNOSIS — R202 Paresthesia of skin: Secondary | ICD-10-CM | POA: Diagnosis not present

## 2018-01-02 DIAGNOSIS — R7309 Other abnormal glucose: Secondary | ICD-10-CM | POA: Diagnosis not present

## 2018-01-02 NOTE — Progress Notes (Signed)
PATIENT: Scott Graves DOB: 1951/05/13  Chief Complaint  Patient presents with  . Numbness    Referred by Dr. Rexene Alberts for second opinion.  Reports numbness/tingling primarily in the tips of his fingers, bottom of his feet and toes.     HISTORICAL  Scott Graves is a 67 year old male, seen in refer by my colleague Dr. Rexene Alberts for evaluation of paresthesia all 4 limbs, initial evaluation was on January 02, 2018.  He has past medical history of hypertension, hyperlipidemia, gout,   Since 2016, he noticed intermittent tingling, needle prick sensation involving his toes, fingertips, fairly symmetric, but intermittent, most noticeable when he relaxed, deny limitations in his daily function, in specific, no neck pain, no low back pain, no gait abnormality, no bowel and bladder incontinence.  He has tried Lyrica up to 225 mg every night without significant help, he does complains of the urge to rubbing his toes and fingertips when he has a transient discomfort,  Recent laboratory evaluation in December 2018 showed elevated ferritin level 453, normal iron overload,  Laboratory evaluation in 2019: Normal CBC hemoglobin of 13.1, BMP showed mild elevated glucose 110, GFR was 75, normal I34 742, folic acid  Previous extensive laboratory evaluation in August 2017, normal TSH, RPR, protein electrophoresis, Lyme titer, heavy metal screen, hepatitis panel, lipid profile were all within normal limits.  EMG nerve conduction study was normal in September 2017.  REVIEW OF SYSTEMS: Full 14 system review of systems performed and notable only for as above  ALLERGIES: Allergies  Allergen Reactions  . Statins     HOME MEDICATIONS: Current Outpatient Medications  Medication Sig Dispense Refill  . allopurinol (ZYLOPRIM) 300 MG tablet Take 300 mg by mouth daily.    Marland Kitchen MICARDIS HCT 40-12.5 MG tablet     . omeprazole (PRILOSEC) 40 MG capsule     . potassium chloride (K-DUR) 10 MEQ tablet Take 10 mEq by  mouth daily.    . pregabalin (LYRICA) 225 MG capsule Take 225 mg by mouth daily.    Marland Kitchen tolterodine (DETROL LA) 4 MG 24 hr capsule Take 4 mg by mouth daily.    Marland Kitchen ZETIA 10 MG tablet      No current facility-administered medications for this visit.     PAST MEDICAL HISTORY: Past Medical History:  Diagnosis Date  . Arthritis   . Colon polyps   . GERD (gastroesophageal reflux disease)   . Gout   . Hyperlipemia   . Hypertension   . Kidney stones     PAST SURGICAL HISTORY: Past Surgical History:  Procedure Laterality Date  . ELBOW BURSA SURGERY Right 09/2017    FAMILY HISTORY: Family History  Problem Relation Age of Onset  . Hypertension Mother   . Diabetes Mother   . Hyperlipidemia Mother   . Hypertension Father   . Hypertension Sister   . Hyperlipidemia Sister   . Heart disease Sister   . Stroke Brother   . Seizures Brother     SOCIAL HISTORY:  Social History   Socioeconomic History  . Marital status: Married    Spouse name: Not on file  . Number of children: 1  . Years of education: HS  . Highest education level: Not on file  Social Needs  . Financial resource strain: Not on file  . Food insecurity - worry: Not on file  . Food insecurity - inability: Not on file  . Transportation needs - medical: Not on file  . Transportation needs - non-medical: Not  on file  Occupational History  . Occupation: Retired  Tobacco Use  . Smoking status: Never Smoker  . Smokeless tobacco: Never Used  Substance and Sexual Activity  . Alcohol use: Yes    Comment: Rarely  . Drug use: No  . Sexual activity: Not on file  Other Topics Concern  . Not on file  Social History Narrative   Rare caffeine use   Denies abuse and feels safe at home.      PHYSICAL EXAM   Vitals:   01/02/18 0926  BP: 118/72  Pulse: 94  Weight: 180 lb 8 oz (81.9 kg)  Height: 5\' 7"  (1.702 m)    Not recorded      Body mass index is 28.27 kg/m.  PHYSICAL EXAMNIATION:  Gen: NAD,  conversant, well nourised, obese, well groomed                     Cardiovascular: Regular rate rhythm, no peripheral edema, warm, nontender. Eyes: Conjunctivae clear without exudates or hemorrhage Neck: Supple, no carotid bruits. Pulmonary: Clear to auscultation bilaterally   NEUROLOGICAL EXAM:  MENTAL STATUS: Speech:    Speech is normal; fluent and spontaneous with normal comprehension.  Cognition:     Orientation to time, place and person     Normal recent and remote memory     Normal Attention span and concentration     Normal Language, naming, repeating,spontaneous speech     Fund of knowledge   CRANIAL NERVES: CN II: Visual fields are full to confrontation. Fundoscopic exam is normal with sharp discs and no vascular changes. Pupils are round equal and briskly reactive to light. CN III, IV, VI: extraocular movement are normal. No ptosis. CN V: Facial sensation is intact to pinprick in all 3 divisions bilaterally. Corneal responses are intact.  CN VII: Face is symmetric with normal eye closure and smile. CN VIII: Hearing is normal to rubbing fingers CN IX, X: Palate elevates symmetrically. Phonation is normal. CN XI: Head turning and shoulder shrug are intact CN XII: Tongue is midline with normal movements and no atrophy.  MOTOR: There is no pronator drift of out-stretched arms. Muscle bulk and tone are normal. Muscle strength is normal.  REFLEXES: Reflexes are 2+ and symmetric at the biceps, triceps, knees, and trace at ankles. Plantar responses are flexor.  SENSORY: Length dependent decrease to light touch, pinprick, preserved bilateral toes positional sensation and vibratory sensation  COORDINATION: Rapid alternating movements and fine finger movements are intact. There is no dysmetria on finger-to-nose and heel-knee-shin.    GAIT/STANCE: Posture is normal. Gait is steady with normal steps, base, arm swing, and turning. Heel and toe walking are normal. Tandem gait is  normal.  Romberg is absent.   DIAGNOSTIC DATA (LABS, IMAGING, TESTING) - I reviewed patient records, labs, notes, testing and imaging myself where available.   ASSESSMENT AND PLAN  Scott Graves is a 67 y.o. male    Intermittent bilateral toes, and the fingertips paresthesia,  Mild length dependent sensory changes, decreased reflex at bilateral ankles,  This could indicate a small fiber neuropathy previous normal EMG nerve conduction study in September 2017,  Skin biopsy  Laboratory evaluation potential etiology  Patient does not want any neuropathic pain medication at this point, previously tried Lyrica without helping  Scott Graves, M.D. Ph.D.  [facility] [address] [place], [place] [address] Ph: [phone] Fax: [phone]  CC: Referring Provider

## 2018-01-04 ENCOUNTER — Telehealth: Payer: Self-pay | Admitting: Neurology

## 2018-01-04 ENCOUNTER — Encounter: Payer: Self-pay | Admitting: *Deleted

## 2018-01-04 LAB — VITAMIN D 25 HYDROXY (VIT D DEFICIENCY, FRACTURES): Vit D, 25-Hydroxy: 16.2 ng/mL — ABNORMAL LOW (ref 30.0–100.0)

## 2018-01-04 LAB — HGB A1C W/O EAG: Hgb A1c MFr Bld: 5.9 % — ABNORMAL HIGH (ref 4.8–5.6)

## 2018-01-04 LAB — ANA W/REFLEX IF POSITIVE: Anti Nuclear Antibody(ANA): NEGATIVE

## 2018-01-04 LAB — COPPER, SERUM: Copper: 209 ug/dL — ABNORMAL HIGH (ref 72–166)

## 2018-01-04 NOTE — Telephone Encounter (Signed)
Spoke to patient - he is aware of results and agreeable to Dr. Rhea Belton recommendations below.

## 2018-01-04 NOTE — Telephone Encounter (Signed)
Please call patient, laboratory evaluation showed mild elevated A1c 5.9, vitamin D level was 16.2,  He should have diet control, exercise for mild elevated glucose. Start vitamin D3 supplement 1000 units, 2 tablets daily.

## 2018-01-08 ENCOUNTER — Encounter: Payer: Self-pay | Admitting: Neurology

## 2018-01-10 ENCOUNTER — Telehealth: Payer: Self-pay | Admitting: Neurology

## 2018-01-10 DIAGNOSIS — R202 Paresthesia of skin: Secondary | ICD-10-CM

## 2018-01-10 NOTE — Telephone Encounter (Signed)
Pt called he is wanting additional labs done due to the elevated lab results. Please call to discuss

## 2018-01-10 NOTE — Telephone Encounter (Signed)
Patient also emailed his request.  Orders have been place in Epic and he has been emailed back.

## 2018-01-11 ENCOUNTER — Other Ambulatory Visit (INDEPENDENT_AMBULATORY_CARE_PROVIDER_SITE_OTHER): Payer: Self-pay

## 2018-01-11 ENCOUNTER — Other Ambulatory Visit: Payer: Self-pay | Admitting: *Deleted

## 2018-01-11 DIAGNOSIS — R202 Paresthesia of skin: Secondary | ICD-10-CM | POA: Diagnosis not present

## 2018-01-11 DIAGNOSIS — Z0289 Encounter for other administrative examinations: Secondary | ICD-10-CM

## 2018-01-12 LAB — HEPATIC FUNCTION PANEL
ALK PHOS: 100 IU/L (ref 39–117)
ALT: 9 IU/L (ref 0–44)
AST: 21 IU/L (ref 0–40)
Albumin: 4.9 g/dL — ABNORMAL HIGH (ref 3.6–4.8)
BILIRUBIN TOTAL: 0.5 mg/dL (ref 0.0–1.2)
Bilirubin, Direct: 0.13 mg/dL (ref 0.00–0.40)
TOTAL PROTEIN: 8 g/dL (ref 6.0–8.5)

## 2018-01-12 LAB — COPPER, SERUM: COPPER: 124 ug/dL (ref 72–166)

## 2018-01-12 LAB — CERULOPLASMIN: Ceruloplasmin: 28.8 mg/dL (ref 16.0–31.0)

## 2018-01-14 DIAGNOSIS — R202 Paresthesia of skin: Secondary | ICD-10-CM | POA: Diagnosis not present

## 2018-01-16 ENCOUNTER — Encounter: Payer: Self-pay | Admitting: Neurology

## 2018-01-16 LAB — COPPER, URINE - RANDOM OR 24 HOUR
COPPER / CREATININE RATIO: 8 ug/g{creat} (ref 0–49)
COPPER 24H UR: 16 ug/(24.h) (ref 3–35)
CREATININE(CRT), U: 0.95 g/L (ref 0.30–3.00)
Copper, Ur: 8 ug/L

## 2018-01-17 ENCOUNTER — Telehealth: Payer: Self-pay | Admitting: Neurology

## 2018-01-17 NOTE — Telephone Encounter (Signed)
Laboratory evaluations from my chart email  Cholesterol 188, LDL 118, hemoglobin of 12.5, A1c was mildly elevated 6.2, creatinine 1.23.

## 2018-01-19 DIAGNOSIS — I10 Essential (primary) hypertension: Secondary | ICD-10-CM | POA: Diagnosis not present

## 2018-01-19 DIAGNOSIS — L309 Dermatitis, unspecified: Secondary | ICD-10-CM | POA: Diagnosis not present

## 2018-01-19 DIAGNOSIS — F064 Anxiety disorder due to known physiological condition: Secondary | ICD-10-CM | POA: Diagnosis not present

## 2018-01-27 ENCOUNTER — Encounter: Payer: Self-pay | Admitting: Neurology

## 2018-01-28 ENCOUNTER — Telehealth: Payer: Self-pay | Admitting: Neurology

## 2018-01-28 NOTE — Telephone Encounter (Signed)
Have ordered skin biopsy at last visit, please put him on schedule, during that visit, we will go over lab evaluations, potential more labs, including HIV

## 2018-01-28 NOTE — Telephone Encounter (Signed)
Spoke to patient - he has been placed on the schedule for a skin biopsy.  He is aware further lab testing will also be discussed at this appt.

## 2018-01-30 ENCOUNTER — Ambulatory Visit (INDEPENDENT_AMBULATORY_CARE_PROVIDER_SITE_OTHER): Payer: Medicare Other | Admitting: Neurology

## 2018-01-30 DIAGNOSIS — G603 Idiopathic progressive neuropathy: Secondary | ICD-10-CM | POA: Diagnosis not present

## 2018-01-30 DIAGNOSIS — R202 Paresthesia of skin: Secondary | ICD-10-CM | POA: Diagnosis not present

## 2018-01-30 NOTE — Progress Notes (Signed)
Patient was in right lateral recombinant position. Sterile technique. 1% lidocaine with epinephrine was used for local anesthesia. Punctuated skin biopsy was performed. 3 mm skin sample were obtained at at left foot, above left extensor digitorum brevis, and left lateral calf, 10 cm above lateral malleolus, lateral thigh, 20 cm below superior iliac spine.  Patient tolerated the procedure well.  The wound was covered with neosporin antibiotic cream and bandage. 

## 2018-01-31 LAB — HIV ANTIBODY (ROUTINE TESTING W REFLEX): HIV SCREEN 4TH GENERATION: NONREACTIVE

## 2018-02-11 ENCOUNTER — Telehealth: Payer: Self-pay | Admitting: *Deleted

## 2018-02-11 NOTE — Telephone Encounter (Signed)
Patient is returning your call.  

## 2018-02-11 NOTE — Telephone Encounter (Signed)
Left message requesting a return call.

## 2018-02-11 NOTE — Telephone Encounter (Signed)
Spoke to patient - he is aware of results.  Feels Lyrica is helpful in controlling his symptoms.

## 2018-02-11 NOTE — Telephone Encounter (Signed)
Skin biopsy results:  Diagnosis:  A) Lt Thigh - Skin with significantly reduced Epidermal Nerve Fiber Densitiy, consistent with small fiber neuropathy.  B) Lt Calf - Skin with significantly reduced Epidermal Nerve Fiber Densitiy, consistent with small fiber neuropathy.  C) Lt Foot - Skin with significantly reduced Epidermal Nerve Fiber Densitiy, consistent with small fiber neuropathy.   Dr. Krista Blue has reviewed the results and they have been sent for scanning.

## 2018-03-18 ENCOUNTER — Encounter: Payer: Self-pay | Admitting: Neurology

## 2018-03-18 ENCOUNTER — Encounter: Payer: Self-pay | Admitting: Gastroenterology

## 2018-03-19 ENCOUNTER — Other Ambulatory Visit: Payer: Self-pay

## 2018-04-02 ENCOUNTER — Emergency Department (HOSPITAL_COMMUNITY): Payer: Medicare Other

## 2018-04-02 ENCOUNTER — Encounter (HOSPITAL_COMMUNITY): Payer: Self-pay | Admitting: Emergency Medicine

## 2018-04-02 ENCOUNTER — Emergency Department (HOSPITAL_COMMUNITY)
Admission: EM | Admit: 2018-04-02 | Discharge: 2018-04-03 | Disposition: A | Discharge status: Home / Self Care | Payer: Medicare Other | Attending: Emergency Medicine | Admitting: Emergency Medicine

## 2018-04-02 DIAGNOSIS — I10 Essential (primary) hypertension: Secondary | ICD-10-CM | POA: Insufficient documentation

## 2018-04-02 DIAGNOSIS — Z79899 Other long term (current) drug therapy: Secondary | ICD-10-CM | POA: Diagnosis not present

## 2018-04-02 DIAGNOSIS — R002 Palpitations: Secondary | ICD-10-CM | POA: Insufficient documentation

## 2018-04-02 LAB — BASIC METABOLIC PANEL
ANION GAP: 9 (ref 5–15)
BUN: 11 mg/dL (ref 6–20)
CALCIUM: 9.8 mg/dL (ref 8.9–10.3)
CO2: 26 mmol/L (ref 22–32)
Chloride: 105 mmol/L (ref 101–111)
Creatinine, Ser: 1.24 mg/dL (ref 0.61–1.24)
GFR calc Af Amer: >60 mL/min (ref >60)
GFR, EST NON AFRICAN AMERICAN: 58 mL/min — AB (ref >60)
Glucose, Bld: 141 mg/dL — ABNORMAL HIGH (ref 65–99)
POTASSIUM: 3.3 mmol/L — AB (ref 3.5–5.1)
SODIUM: 140 mmol/L (ref 135–145)

## 2018-04-02 LAB — I-STAT TROPONIN, ED: TROPONIN I, POC: 0.01 ng/mL (ref 0.00–0.08)

## 2018-04-02 LAB — CBC
HEMATOCRIT: 37.9 % — AB (ref 39.0–52.0)
HEMOGLOBIN: 11.9 g/dL — AB (ref 13.0–17.0)
MCH: 25.9 pg — ABNORMAL LOW (ref 26.0–34.0)
MCHC: 31.4 g/dL (ref 30.0–36.0)
MCV: 82.4 fL (ref 78.0–100.0)
Platelets: 247 10*3/uL (ref 150–400)
RBC: 4.6 MIL/uL (ref 4.22–5.81)
RDW: 13.1 % (ref 11.5–15.5)
WBC: 5.8 10*3/uL (ref 4.0–10.5)

## 2018-04-02 NOTE — ED Triage Notes (Signed)
Pt reports palpitations and "feeling strange" at 330PM today. Reports he went up stairs at that time and became short of breath. bp at that time 190/100. Improved with rest

## 2018-04-03 ENCOUNTER — Other Ambulatory Visit: Payer: Self-pay

## 2018-04-03 NOTE — ED Provider Notes (Signed)
[facility] EMERGENCY DEPARTMENT Provider Note   CSN: [id number] Arrival date & time: 04/02/18  2035     History   Chief Complaint Chief Complaint  Patient presents with  . Palpitations    HPI Scott Graves is a 67 y.o. male.  67 yo M with a cc of palpitations.  This occurred at 330 and lasted for about 30 minutes.  The patient had been feeling a bit weak and whenever he got up to move around felt that his heart was racing.  This resolved.  He had some mild shortness of breath with it.  Felt that his heart was regular.  He checked his blood pressure multiple times and found it to be elevated.  He denied any change in his diet.  He had not had anything since breakfast.  He denies dark stool or blood in his stool denies illness prior to this event.  The history is provided by the patient.  Palpitations   This is a new problem. The current episode started 6 to 12 hours ago. The problem occurs rarely. The problem has been resolved. On average, each episode lasts 20 minutes. Associated symptoms include weakness and shortness of breath. Pertinent negatives include no fever, no chest pain, no abdominal pain, no vomiting and no headaches. He has tried nothing for the symptoms. The treatment provided no relief. There are no known risk factors.    Past Medical History:  Diagnosis Date  . Arthritis   . Colon polyps   . GERD (gastroesophageal reflux disease)   . Gout   . Hyperlipemia   . Hypertension   . Kidney stones     Patient Active Problem List   Diagnosis Date Noted  . Olecranon bursitis, right elbow 09/24/2017  . Gout 09/19/2016  . Essential hypertension 09/19/2016  . Paresthesia 09/19/2016  . GERD (gastroesophageal reflux disease) 09/19/2016    Past Surgical History:  Procedure Laterality Date  . ELBOW BURSA SURGERY Right 09/2017        Home Medications    Prior to Admission medications   Medication Sig Start Date End Date Taking? Authorizing  Provider  allopurinol (ZYLOPRIM) 300 MG tablet Take 300 mg by mouth daily.    [provider]  Cholecalciferol (VITAMIN D3) 2000 units TABS Take 2,000 Units by mouth daily.    [provider]  MICARDIS HCT 40-12.5 MG tablet  05/05/16   [provider]  omeprazole (PRILOSEC) 40 MG capsule  05/26/16   [provider]  potassium chloride (K-DUR) 10 MEQ tablet Take 10 mEq by mouth daily.    [provider]  pregabalin (LYRICA) 225 MG capsule Take 225 mg by mouth daily.    [provider]  tolterodine (DETROL LA) 4 MG 24 hr capsule Take 4 mg by mouth daily.    [provider]  ZETIA 10 MG tablet  05/05/16   [provider]    Family History Family History  Problem Relation Age of Onset  . Hypertension Mother   . Diabetes Mother   . Hyperlipidemia Mother   . Hypertension Father   . Hypertension Sister   . Hyperlipidemia Sister   . Heart disease Sister   . Stroke Brother   . Seizures Brother     Social History Social History   Tobacco Use  . Smoking status: Never Smoker  . Smokeless tobacco: Never Used  Substance Use Topics  . Alcohol use: Yes    Comment: Rarely  . Drug use:  No     Allergies   Statins   Review of Systems Review of Systems  Constitutional: Negative for chills and fever.  HENT: Negative for congestion and facial swelling.   Eyes: Negative for discharge and visual disturbance.  Respiratory: Positive for shortness of breath.   Cardiovascular: Positive for palpitations. Negative for chest pain.  Gastrointestinal: Negative for abdominal pain, diarrhea and vomiting.  Musculoskeletal: Negative for arthralgias and myalgias.  Skin: Negative for color change and rash.  Neurological: Positive for weakness. Negative for tremors, syncope and headaches.  Psychiatric/Behavioral: Negative for confusion and dysphoric mood.     Physical Exam Updated Vital Signs BP 136/84   Pulse (!) 56   Temp (!)  97.4 F (36.3 C) (Oral)   Resp 13   Ht 5\' 7"  (1.702 m)   Wt 78 kg (172 lb)   SpO2 99%   BMI 26.94 kg/m   Physical Exam  Constitutional: He is oriented to person, place, and time. He appears well-developed and well-nourished.  HENT:  Head: Normocephalic and atraumatic.  Eyes: Pupils are equal, round, and reactive to light. EOM are normal.  Neck: Normal range of motion. Neck supple. No JVD present.  Cardiovascular: Normal rate and regular rhythm. Exam reveals no gallop and no friction rub.  No murmur heard. Pulmonary/Chest: No respiratory distress. He has no wheezes.  Abdominal: He exhibits no distension. There is no rebound and no guarding.  Musculoskeletal: Normal range of motion.  Neurological: He is alert and oriented to person, place, and time.  Skin: No rash noted. No pallor.  Psychiatric: He has a normal mood and affect. His behavior is normal.  Nursing note and vitals reviewed.    ED Treatments / Results  Labs (all labs ordered are listed, but only abnormal results are displayed) Labs Reviewed  BASIC METABOLIC PANEL - Abnormal; Notable for the following components:      Result Value   Potassium 3.3 (*)    Glucose, Bld 141 (*)    GFR calc non Af Amer 58 (*)    All other components within normal limits  CBC - Abnormal; Notable for the following components:   Hemoglobin 11.9 (*)    HCT 37.9 (*)    MCH 25.9 (*)    All other components within normal limits  I-STAT TROPONIN, ED    EKG EKG Interpretation  Date/Time:  Tuesday Apr 02 2018 20:40:34 EDT Ventricular Rate:  85 PR Interval:  174 QRS Duration: 86 QT Interval:  358 QTC Calculation: 426 R Axis:   67 Text Interpretation:  Normal sinus rhythm Minimal voltage criteria for LVH, may be normal variant Borderline ECG No significant change since last tracing Confirmed by Deno Etienne [phone]) on 04/03/2018 12:01:22 AM   Radiology Dg Chest 2 View  Result Date: 04/02/2018 CLINICAL DATA:  Heart palpitations. EXAM:  CHEST - 2 VIEW COMPARISON:  None. FINDINGS: The heart size and mediastinal contours are within normal limits. Both lungs are clear. The visualized skeletal structures are unremarkable. IMPRESSION: No active cardiopulmonary disease. Electronically Signed   By: Ulyses Jarred M.D.   On: 04/02/2018 21:05    Procedures Procedures (including critical care time)  Medications Ordered in ED Medications - No data to display   Initial Impression / Assessment and Plan / ED Course  I have reviewed the triage vital signs and the nursing notes.  Pertinent labs & imaging results that were available during my care of the patient were reviewed by me and considered in my medical  decision making (see chart for details).     67 yo M with a chief complaint of palpitations and shortness of breath.  This occurred for a short time period today.  It has resolved.  Patient was ambulated in the emergency department without any issue.  No tachycardia no hypoxia.  He had labs drawn in triage a troponin that was negative a BMP with mild hypokalemia, chest x-ray reviewed by me without focal infiltrate or widened mediastinum.  He has equal upper extremity pulses.  Never had chest pain associated with this.  Has had chronic headaches which are essentially unchanged.  I do not feel that further imaging is warranted.  I am unsure of the exact etiology of the symptoms, they could be stress related as the patient stated that his significant other had him look at engagement rings just prior to the symptoms.  recommended that he follow-up with his PCP for possible Holter or echocardiogram.   1:04 AM:  I have discussed the diagnosis/risks/treatment options with the patient and family and believe the pt to be eligible for discharge home to follow-up with PCP. We also discussed returning to the ED immediately if new or worsening sx occur. We discussed the sx which are most concerning (e.g., sudden worsening pain, fever, inability to  tolerate by mouth) that necessitate immediate return. Medications administered to the patient during their visit and any new prescriptions provided to the patient are listed below.  Medications given during this visit Medications - No data to display    The patient appears reasonably screen and/or stabilized for discharge and I doubt any other medical condition or other [facility] requiring further screening, evaluation, or treatment in the ED at this time prior to discharge.    Final Clinical Impressions(s) / ED Diagnoses   Final diagnoses:  Heart palpitations    ED Discharge Orders    None       Deno Etienne, DO 04/03/18 0105

## 2018-04-03 NOTE — ED Notes (Signed)
o2 sat started at 100% before walking. While ambulating patient in hallway, patient went down to 97%, but remained between 97 and 100 percent through the entirety of the walk. Patient back in bed and on the monitor.

## 2018-04-03 NOTE — ED Notes (Signed)
Discharge instructions discussed with Pt. Pt verbalized understanding. Pt stable and ambulatory.    

## 2018-04-03 NOTE — Discharge Instructions (Signed)
Follow up with your family doc.  Return for any worsening.  You may need a holter monitor or an [place] of your heart.  Discuss with your PCP

## 2018-04-06 DIAGNOSIS — E876 Hypokalemia: Secondary | ICD-10-CM | POA: Diagnosis not present

## 2018-04-06 DIAGNOSIS — E785 Hyperlipidemia, unspecified: Secondary | ICD-10-CM | POA: Diagnosis not present

## 2018-04-06 DIAGNOSIS — R000 Tachycardia, unspecified: Secondary | ICD-10-CM | POA: Diagnosis not present

## 2018-04-06 DIAGNOSIS — I10 Essential (primary) hypertension: Secondary | ICD-10-CM | POA: Diagnosis not present

## 2018-04-16 DIAGNOSIS — R972 Elevated prostate specific antigen [PSA]: Secondary | ICD-10-CM | POA: Diagnosis not present

## 2018-04-23 DIAGNOSIS — N401 Enlarged prostate with lower urinary tract symptoms: Secondary | ICD-10-CM | POA: Diagnosis not present

## 2018-04-23 DIAGNOSIS — R3915 Urgency of urination: Secondary | ICD-10-CM | POA: Diagnosis not present

## 2018-04-23 DIAGNOSIS — Z87442 Personal history of urinary calculi: Secondary | ICD-10-CM | POA: Diagnosis not present

## 2018-04-25 DIAGNOSIS — R002 Palpitations: Secondary | ICD-10-CM | POA: Diagnosis not present

## 2018-06-12 ENCOUNTER — Other Ambulatory Visit: Payer: Self-pay

## 2018-08-01 DIAGNOSIS — E876 Hypokalemia: Secondary | ICD-10-CM | POA: Diagnosis not present

## 2018-08-01 DIAGNOSIS — R7309 Other abnormal glucose: Secondary | ICD-10-CM | POA: Diagnosis not present

## 2018-08-01 DIAGNOSIS — E785 Hyperlipidemia, unspecified: Secondary | ICD-10-CM | POA: Diagnosis not present

## 2018-08-01 DIAGNOSIS — I10 Essential (primary) hypertension: Secondary | ICD-10-CM | POA: Diagnosis not present

## 2018-08-01 DIAGNOSIS — R0600 Dyspnea, unspecified: Secondary | ICD-10-CM | POA: Diagnosis not present

## 2018-08-01 DIAGNOSIS — D649 Anemia, unspecified: Secondary | ICD-10-CM | POA: Diagnosis not present

## 2018-08-05 DIAGNOSIS — Z6827 Body mass index (BMI) 27.0-27.9, adult: Secondary | ICD-10-CM | POA: Diagnosis not present

## 2018-08-05 DIAGNOSIS — E785 Hyperlipidemia, unspecified: Secondary | ICD-10-CM | POA: Diagnosis not present

## 2018-08-05 DIAGNOSIS — I10 Essential (primary) hypertension: Secondary | ICD-10-CM | POA: Diagnosis not present

## 2018-08-05 DIAGNOSIS — R7309 Other abnormal glucose: Secondary | ICD-10-CM | POA: Diagnosis not present

## 2018-08-05 DIAGNOSIS — G622 Polyneuropathy due to other toxic agents: Secondary | ICD-10-CM | POA: Diagnosis not present

## 2018-08-08 DIAGNOSIS — N182 Chronic kidney disease, stage 2 (mild): Secondary | ICD-10-CM | POA: Diagnosis not present

## 2018-08-15 ENCOUNTER — Ambulatory Visit (INDEPENDENT_AMBULATORY_CARE_PROVIDER_SITE_OTHER): Payer: Medicare Other | Admitting: Physician Assistant

## 2018-08-15 ENCOUNTER — Other Ambulatory Visit (INDEPENDENT_AMBULATORY_CARE_PROVIDER_SITE_OTHER): Payer: Medicare Other

## 2018-08-15 ENCOUNTER — Encounter: Payer: Self-pay | Admitting: Physician Assistant

## 2018-08-15 VITALS — BP 116/82 | HR 104 | Ht 67.0 in | Wt 169.0 lb

## 2018-08-15 DIAGNOSIS — D649 Anemia, unspecified: Secondary | ICD-10-CM | POA: Diagnosis not present

## 2018-08-15 DIAGNOSIS — Z86010 Personal history of colonic polyps: Secondary | ICD-10-CM

## 2018-08-15 LAB — COMPREHENSIVE METABOLIC PANEL
ALBUMIN: 4.8 g/dL (ref 3.5–5.2)
ALK PHOS: 81 U/L (ref 39–117)
ALT: 11 U/L (ref 0–53)
AST: 17 U/L (ref 0–37)
BUN: 26 mg/dL — ABNORMAL HIGH (ref 6–23)
CALCIUM: 10.6 mg/dL — AB (ref 8.4–10.5)
CHLORIDE: 102 meq/L (ref 96–112)
CO2: 24 mEq/L (ref 19–32)
Creatinine, Ser: 1.5 mg/dL (ref 0.40–1.50)
GFR: 59.87 mL/min — AB (ref >60.00)
Glucose, Bld: 175 mg/dL — ABNORMAL HIGH (ref 70–99)
Potassium: 3.5 mEq/L (ref 3.5–5.1)
Sodium: 136 mEq/L (ref 135–145)
Total Bilirubin: 0.5 mg/dL (ref 0.2–1.2)
Total Protein: 8.3 g/dL (ref 6.0–8.3)

## 2018-08-15 LAB — CBC WITH DIFFERENTIAL/PLATELET
BASOS PCT: 0.7 % (ref 0.0–3.0)
Basophils Absolute: 0 10*3/uL (ref 0.0–0.1)
Eosinophils Absolute: 0.2 10*3/uL (ref 0.0–0.7)
Eosinophils Relative: 3.9 % (ref 0.0–5.0)
HEMATOCRIT: 41 % (ref 39.0–52.0)
HEMOGLOBIN: 13.3 g/dL (ref 13.0–17.0)
LYMPHS PCT: 35.6 % (ref 12.0–46.0)
Lymphs Abs: 1.6 10*3/uL (ref 0.7–4.0)
MCHC: 32.4 g/dL (ref 30.0–36.0)
MCV: 81.4 fl (ref 78.0–100.0)
Monocytes Absolute: 0.4 10*3/uL (ref 0.1–1.0)
Monocytes Relative: 8.5 % (ref 3.0–12.0)
Neutro Abs: 2.3 10*3/uL (ref 1.4–7.7)
Neutrophils Relative %: 51.3 % (ref 43.0–77.0)
Platelets: 250 10*3/uL (ref 150.0–400.0)
RBC: 5.04 Mil/uL (ref 4.22–5.81)
RDW: 13.3 % (ref 11.5–15.5)
WBC: 4.5 10*3/uL (ref 4.0–10.5)

## 2018-08-15 LAB — FERRITIN: FERRITIN: 361 ng/mL — AB (ref 22.0–322.0)

## 2018-08-15 LAB — IBC PANEL
IRON: 83 ug/dL (ref 42–165)
Saturation Ratios: 24.4 % (ref 20.0–50.0)
Transferrin: 243 mg/dL (ref 212.0–360.0)

## 2018-08-15 NOTE — Patient Instructions (Signed)
Your provider has requested that you go to the basement level for lab work before leaving today. Press "B" on the elevator. The lab is located at the first door on the left as you exit the elevator.  

## 2018-08-15 NOTE — Progress Notes (Signed)
Chief Complaint: Dark stools, anemia  HPI:     [name] is a 67 year old male with a past medical history as listed below, known to Dr. Silverio Decamp, who presents to clinic today with a complaint of anemia and dark stools.    12/06/2017 office visit for dark stool and anemia.  At that time patient complained of dark stool for 2 months but has been taking Pepto-Bismol at bedtime, when he stopped this that returned to normal.  His hemoglobin have been slightly lower at 12.5 when he had labs in November through his PCP and he complained of occasional dysphagia with liquids worse with soda.  Apparently had multiple EGDs and colonoscopies in [place] in [place] outlined below.  At that time a CBC, BMP, folate and B12 were checked.  Patient had iron studies and ferritin within normal limits on recent labs.  Fecal Hemoccult was checked.  If positive it was discussed that he can have a small bowel video capsule to further evaluate.  Also had barium esophagram with tablet to evaluate intermittent liquid dysphagia.  We discussed she is due for surveillance colonoscopy in 2021 for colorectal cancer with history of adenoma.    12/06/2017 labs showed a CBC improved hemoglobin at 13.1 and no other significant lab abnormality.  12/18/2017 dG esophagus was normal.  Hemoccult cards were negative.    04/02/2018 CBC showed hemoglobin of 11.9 (13.1 8 months ago).      08/02/2018 hemoglobin 12.1 (baseline around 12.5-13).  BUN normal.  CMP normal.  CBC otherwise normal.   Today, patient presents to clinic accompanied by his wife and tells me that he is feeling well.  He did have one day of a dark stool but had taken Pepto-Bismol beforehand and this has cleared up since.  He denies any other GI complaints or concerns.  He has had no abdominal pain, no palpitations, no shortness of breath or dizziness.  Patient tells me that he has been anemic off and on throughout his life and had a GI work-up in the past which was all  negative/normal.    Denies fever, chills, weight loss, heartburn, reflux, change in bowel habits or abdominal pain.  Previous GI history: Barium esophagogram was normal 2012   Colonoscopy 01/2003: Small transverse colon polyp adenomatous polyp   Colonoscopy 2009 : mild internal hemorrhoids   Colonoscopy 2012: Four sessile polyps removed from descending, transverse and ascending colon    Colonoscopy 2016: Dimunitive  Polyp removed from ascending colon, tubular adenoma   EGD 2012: Small hiatal hernia with schatzki's ring, no dilation   Swallow study 2016 : mild pharyngeal dysphagia  Past Medical History:  Diagnosis Date  . Arthritis   . Colon polyps   . GERD (gastroesophageal reflux disease)   . Gout   . Hyperlipemia   . Hypertension   . Kidney stones     Past Surgical History:  Procedure Laterality Date  . ELBOW BURSA SURGERY Right 09/2017    Current Outpatient Medications  Medication Sig Dispense Refill  . allopurinol (ZYLOPRIM) 300 MG tablet Take 300 mg by mouth daily.    . Cholecalciferol (VITAMIN D3) 2000 units TABS Take 2,000 Units by mouth daily.    Marland Kitchen MICARDIS HCT 40-12.5 MG tablet     . omeprazole (PRILOSEC) 40 MG capsule     . potassium chloride (K-DUR) 10 MEQ tablet Take 10 mEq by mouth daily.    . pregabalin (LYRICA) 225 MG capsule Take 225 mg by mouth daily.    Marland Kitchen  tolterodine (DETROL LA) 4 MG 24 hr capsule Take 4 mg by mouth daily.    Marland Kitchen ZETIA 10 MG tablet      No current facility-administered medications for this visit.     Allergies as of 08/15/2018 - Review Complete 08/15/2018  Allergen Reaction Noted  . Statins  06/08/2016    Family History  Problem Relation Age of Onset  . Hypertension Mother   . Diabetes Mother   . Hyperlipidemia Mother   . Hypertension Father   . Hypertension Sister   . Hyperlipidemia Sister   . Heart disease Sister   . Stroke Brother   . Seizures Brother     Social History   Socioeconomic History  . Marital status:  Married    Spouse name: Not on file  . Number of children: 1  . Years of education: HS  . Highest education level: Not on file  Occupational History  . Occupation: Retired  Scientific laboratory technician  . Financial resource strain: Not on file  . Food insecurity:    Worry: Not on file    Inability: Not on file  . Transportation needs:    Medical: Not on file    Non-medical: Not on file  Tobacco Use  . Smoking status: Never Smoker  . Smokeless tobacco: Never Used  Substance and Sexual Activity  . Alcohol use: Yes    Comment: Rarely  . Drug use: No  . Sexual activity: Not on file  Lifestyle  . Physical activity:    Days per week: Not on file    Minutes per session: Not on file  . Stress: Not on file  Relationships  . Social connections:    Talks on phone: Not on file    Gets together: Not on file    Attends religious service: Not on file    Active member of club or organization: Not on file    Attends meetings of clubs or organizations: Not on file    Relationship status: Not on file  . Intimate partner violence:    Fear of current or ex partner: Not on file    Emotionally abused: Not on file    Physically abused: Not on file    Forced sexual activity: Not on file  Other Topics Concern  . Not on file  Social History Narrative   Rare caffeine use   Denies abuse and feels safe at home.     Review of Systems:    Constitutional: No weight loss, fever or chills Cardiovascular: No chest pain Respiratory: No SOB  Gastrointestinal: See HPI and otherwise negative Genitourinary: No dysuria  Neurological: No headache, dizziness or syncope Musculoskeletal: No new muscle or joint pain Hematologic: No bleeding Psychiatric: No history of depression or anxiety   Physical Exam:  Vital signs: BP 116/82   Pulse (!) 104   Ht 5\' 7"  (1.702 m)   Wt 169 lb (76.7 kg)   BMI 26.47 kg/m   Constitutional:   Pleasant AA male appears to be in NAD, Well developed, Well nourished, alert and  cooperative Respiratory: Respirations even and unlabored. Lungs clear to auscultation bilaterally.   No wheezes, crackles, or rhonchi.  Cardiovascular: Normal S1, S2. No MRG. Regular rate and rhythm. No peripheral edema, cyanosis or pallor.  Gastrointestinal:  Soft, nondistended, nontender. No rebound or guarding. Normal bowel sounds. No appreciable masses or hepatomegaly. Rectal:  Not performed.  Psychiatric: Demonstrates good judgement and reason without abnormal affect or behaviors.  See HPI for recent labs.  Assessment: 1.  Anemia: Patient had one episode of dark stool after using Pepto-Bismol, this has cleared since, was seen in January for similar complaint, CBC, BMP, folate and B12 as well as iron studies and ferritin were checked at that time and were normal and hemoglobin had increased.  Fecal Hemoccult cards were negative x3. 2.  History of adenomatous polyps: Repeat due in 2021 for surveillance  Plan: 1.  Ordered CBC, CMP, iron studies including ferritin and Hemoccult cards x3. 2.  If above returns negative/normal would recommend patient see hematologist for further work-up of his anemia as he has never seen one in the past. 3.  Patient to follow in clinic per recommendations after labs above. Discussed that he is not due for a screening colonoscopy until 2021 due to his history of adenomatous polyps.  Ellouise Newer, PA-C [facility] 08/15/2018, 11:21 AM  Cc: Lucianne Lei, MD

## 2018-08-18 NOTE — Progress Notes (Signed)
Reviewed and agree with documentation and assessment and plan. If heme occult positive, will consider repeating EGD and +/- small bowel video capsule K. Denzil Magnuson , MD

## 2018-09-16 DIAGNOSIS — Z6827 Body mass index (BMI) 27.0-27.9, adult: Secondary | ICD-10-CM | POA: Diagnosis not present

## 2018-09-16 DIAGNOSIS — F064 Anxiety disorder due to known physiological condition: Secondary | ICD-10-CM | POA: Diagnosis not present

## 2018-09-16 DIAGNOSIS — E785 Hyperlipidemia, unspecified: Secondary | ICD-10-CM | POA: Diagnosis not present

## 2018-09-16 DIAGNOSIS — R7309 Other abnormal glucose: Secondary | ICD-10-CM | POA: Diagnosis not present

## 2018-09-16 DIAGNOSIS — I10 Essential (primary) hypertension: Secondary | ICD-10-CM | POA: Diagnosis not present

## 2018-09-23 ENCOUNTER — Encounter: Payer: Self-pay | Admitting: Cardiovascular Disease

## 2018-09-30 ENCOUNTER — Other Ambulatory Visit: Payer: Self-pay

## 2018-10-02 DIAGNOSIS — I129 Hypertensive chronic kidney disease with stage 1 through stage 4 chronic kidney disease, or unspecified chronic kidney disease: Secondary | ICD-10-CM | POA: Diagnosis not present

## 2018-10-02 DIAGNOSIS — N182 Chronic kidney disease, stage 2 (mild): Secondary | ICD-10-CM | POA: Diagnosis not present

## 2018-10-02 DIAGNOSIS — M109 Gout, unspecified: Secondary | ICD-10-CM | POA: Diagnosis not present

## 2018-10-02 DIAGNOSIS — Q615 Medullary cystic kidney: Secondary | ICD-10-CM | POA: Diagnosis not present

## 2018-10-07 NOTE — Progress Notes (Addendum)
Cardiology Office Note   Date:  10/14/2018   ID:  Scott Graves, DOB [birth year], MRN [id number]  PCP:  Lucianne Lei, MD  Cardiologist:   Jenkins Rouge, MD   No chief complaint on file.     History of Present Illness: Scott Graves is a 67 y.o. male who presents for consultation regarding palpitations. Referred by Dr Criss Rosales. Reviewed notes From ER visit May 2019 palpitations ? From stress was looking at engagement rings. Telemetry with no arrhythmia r/o D/c home with no change in Rx History of HTN and HLD. Also has polyneuropathy followed by [facility] Previous Normal nerve conduction studies and failure of Lyrica Rx.   He had a normal stress echo in [place] Seen in ER with nausea April 2019 ? ECG changes to my eye non specific R/O and no other symptoms  Event monitor from [place] reviewed and normal   He is retired from [employer] and [employer] worked in Government social research officer records. Wife with him daughters live in [place] Active with no palpitations chest pain syncope or dyspnea   Chart prep entered past surgical history of angioplasty which was not accurate    Past Medical History:  Diagnosis Date  . Abnormal EKG   . Arthritis   . Benign hypertensive heart disease without HF (heart failure)   . Colon polyps   . GERD (gastroesophageal reflux disease)   . Gout   . Hyperlipemia   . Hypertension   . Kidney stones   . Palpitations       Current Outpatient Medications  Medication Sig Dispense Refill  . allopurinol (ZYLOPRIM) 300 MG tablet Take 300 mg by mouth daily.    . Cholecalciferol (VITAMIN D3) 2000 units TABS Take 2,000 Units by mouth daily.    Marland Kitchen MICARDIS HCT 40-12.5 MG tablet     . omeprazole (PRILOSEC) 40 MG capsule     . potassium chloride (K-DUR) 10 MEQ tablet Take 10 mEq by mouth daily.    . pregabalin (LYRICA) 225 MG capsule Take 225 mg by mouth daily.    Marland Kitchen tolterodine (DETROL LA) 4 MG 24 hr capsule Take 4 mg by mouth daily.    Marland Kitchen ZETIA 10 MG tablet      No current  facility-administered medications for this visit.     Allergies:   Statins    Social History:  The patient  reports that he has never smoked. He has never used smokeless tobacco. He reports that he drinks alcohol. He reports that he does not use drugs.   Family History:  The patient's family history includes Diabetes in his mother; Heart disease in his sister; Hyperlipidemia in his mother and sister; Hypertension in his father, mother, and sister; Seizures in his brother; Stroke in his brother.    ROS:  Please see the history of present illness.   Otherwise, review of systems are positive for none.   All other systems are reviewed and negative.    PHYSICAL EXAM: VS:  BP 126/80   Pulse 88   Ht 5\' 7"  (1.702 m)   Wt 174 lb 3.2 oz (79 kg)   SpO2 98%   BMI 27.28 kg/m  , BMI Body mass index is 27.28 kg/m. Affect appropriate Healthy:  appears stated age 53: normal Neck supple with no adenopathy JVP normal no bruits no thyromegaly Lungs clear with no wheezing and good diaphragmatic motion Heart:  S1/S2 no murmur, no rub, gallop or click PMI normal Abdomen: benighn, BS positve, no tenderness, no  AAA no bruit.  No HSM or HJR Distal pulses intact with no bruits No edema Neuro non-focal Skin warm and dry No muscular weakness    EKG:  04/03/18 SR rate 85 voltage for LVH    Recent Labs: 08/15/2018: ALT 11; BUN 26; Creatinine, Ser 1.50; Hemoglobin 13.3; Platelets 250.0; Potassium 3.5; Sodium 136    Lipid Panel    Component Value Date/Time   CHOL 187 06/07/2016   TRIG 129 06/07/2016   HDL 53 06/07/2016   LDLCALC 108 06/07/2016      Wt Readings from Last 3 Encounters:  10/14/18 174 lb 3.2 oz (79 kg)  08/15/18 169 lb (76.7 kg)  04/02/18 172 lb (78 kg)      Other studies Reviewed: Additional studies/ records that were reviewed today include: Notes from Dr Einar Gip Cardiology Notes from ER labs ECG and CXR.    ASSESSMENT AND PLAN:  1.  Palpitations benign no  arrhythmia on event monitor observe  2. HTN: Well controlled.  Continue current medications and low sodium Dash type diet.   3. HLD:  Intolerant to statins on zetia f/u with primary at goal for patient with no vascular disease    Current medicines are reviewed at length with the patient today.  The patient does not have concerns regarding medicines.  The following changes have been made:  no change  Labs/ tests ordered today include: None  No orders of the defined types were placed in this encounter.    Disposition:   FU with cardiology PRN      Signed, Jenkins Rouge, MD  10/14/2018 10:12 AM    [address], [place], [place]  [address] Phone: [phone]; Fax: [phone]

## 2018-10-14 ENCOUNTER — Ambulatory Visit (INDEPENDENT_AMBULATORY_CARE_PROVIDER_SITE_OTHER): Payer: Medicare Other | Admitting: Cardiovascular Disease

## 2018-10-14 VITALS — BP 126/80 | HR 88 | Ht 67.0 in | Wt 174.2 lb

## 2018-10-14 DIAGNOSIS — R002 Palpitations: Secondary | ICD-10-CM | POA: Diagnosis not present

## 2018-10-14 DIAGNOSIS — I10 Essential (primary) hypertension: Secondary | ICD-10-CM

## 2018-10-14 DIAGNOSIS — E782 Mixed hyperlipidemia: Secondary | ICD-10-CM

## 2018-10-14 NOTE — Patient Instructions (Addendum)
Medication Instructions:   If you need a refill on your cardiac medications before your next appointment, please call your pharmacy.   Lab work:  If you have labs (blood work) drawn today and your tests are completely normal, you will receive your results only by: . MyChart Message (if you have MyChart) OR . A paper copy in the mail If you have any lab test that is abnormal or we need to change your treatment, we will call you to review the results.  Testing/Procedures: NONE ordered at this time.  Follow-Up: At [facility], you and your health needs are our priority.  As part of our continuing mission to provide you with exceptional heart care, we have created designated Provider Care Teams.  These Care Teams include your primary Cardiologist (physician) and Advanced Practice Providers (APPs -  Physician Assistants and Nurse Practitioners) who all work together to provide you with the care you need, when you need it. Your physician recommends that you schedule a follow-up appointment as needed with Dr. Nishan.     

## 2018-11-04 DIAGNOSIS — Z0000 Encounter for general adult medical examination without abnormal findings: Secondary | ICD-10-CM | POA: Diagnosis not present

## 2018-11-18 DIAGNOSIS — N3281 Overactive bladder: Secondary | ICD-10-CM | POA: Diagnosis not present

## 2018-11-18 DIAGNOSIS — R3915 Urgency of urination: Secondary | ICD-10-CM | POA: Diagnosis not present

## 2018-11-18 DIAGNOSIS — N401 Enlarged prostate with lower urinary tract symptoms: Secondary | ICD-10-CM | POA: Diagnosis not present

## 2018-12-13 DIAGNOSIS — H40053 Ocular hypertension, bilateral: Secondary | ICD-10-CM | POA: Diagnosis not present

## 2018-12-13 DIAGNOSIS — H2513 Age-related nuclear cataract, bilateral: Secondary | ICD-10-CM | POA: Diagnosis not present

## 2018-12-13 DIAGNOSIS — H1013 Acute atopic conjunctivitis, bilateral: Secondary | ICD-10-CM | POA: Diagnosis not present

## 2018-12-16 DIAGNOSIS — R7309 Other abnormal glucose: Secondary | ICD-10-CM | POA: Diagnosis not present

## 2018-12-16 DIAGNOSIS — I10 Essential (primary) hypertension: Secondary | ICD-10-CM | POA: Diagnosis not present

## 2018-12-16 DIAGNOSIS — E7800 Pure hypercholesterolemia, unspecified: Secondary | ICD-10-CM | POA: Diagnosis not present

## 2018-12-17 DIAGNOSIS — K219 Gastro-esophageal reflux disease without esophagitis: Secondary | ICD-10-CM | POA: Diagnosis not present

## 2018-12-17 DIAGNOSIS — G622 Polyneuropathy due to other toxic agents: Secondary | ICD-10-CM | POA: Diagnosis not present

## 2018-12-17 DIAGNOSIS — E785 Hyperlipidemia, unspecified: Secondary | ICD-10-CM | POA: Diagnosis not present

## 2018-12-17 DIAGNOSIS — I10 Essential (primary) hypertension: Secondary | ICD-10-CM | POA: Diagnosis not present

## 2018-12-31 DIAGNOSIS — L821 Other seborrheic keratosis: Secondary | ICD-10-CM | POA: Diagnosis not present

## 2018-12-31 DIAGNOSIS — B078 Other viral warts: Secondary | ICD-10-CM | POA: Diagnosis not present

## 2019-01-02 DIAGNOSIS — L304 Erythema intertrigo: Secondary | ICD-10-CM | POA: Diagnosis not present

## 2019-01-20 DIAGNOSIS — B078 Other viral warts: Secondary | ICD-10-CM | POA: Diagnosis not present

## 2019-01-28 DIAGNOSIS — R3915 Urgency of urination: Secondary | ICD-10-CM | POA: Diagnosis not present

## 2019-01-28 DIAGNOSIS — N342 Other urethritis: Secondary | ICD-10-CM | POA: Diagnosis not present

## 2019-04-02 DIAGNOSIS — A630 Anogenital (venereal) warts: Secondary | ICD-10-CM | POA: Diagnosis not present

## 2019-04-02 DIAGNOSIS — D485 Neoplasm of uncertain behavior of skin: Secondary | ICD-10-CM | POA: Diagnosis not present

## 2019-04-02 DIAGNOSIS — L821 Other seborrheic keratosis: Secondary | ICD-10-CM | POA: Diagnosis not present

## 2019-04-08 ENCOUNTER — Encounter: Payer: Self-pay | Admitting: Podiatry

## 2019-04-08 ENCOUNTER — Other Ambulatory Visit: Payer: Self-pay

## 2019-04-08 ENCOUNTER — Ambulatory Visit (INDEPENDENT_AMBULATORY_CARE_PROVIDER_SITE_OTHER): Payer: Medicare Other | Admitting: Podiatry

## 2019-04-08 VITALS — Temp 98.1°F

## 2019-04-08 DIAGNOSIS — Q828 Other specified congenital malformations of skin: Secondary | ICD-10-CM | POA: Diagnosis not present

## 2019-04-08 DIAGNOSIS — G629 Polyneuropathy, unspecified: Secondary | ICD-10-CM | POA: Diagnosis not present

## 2019-04-08 NOTE — Progress Notes (Signed)
This patient presents the office with chief complaint of painful calluses on the bottom of both feet.  He states he has corns on the fifth toes of both feet but they are not painful.  He says the calluses under his feet are painful walking and wearing his shoes.  Patient states that the pain worsens as the callus thickening.  This patient admits to having neuropathy for which he takes Lyrica.  He presents the office today for an evaluation and treatment of the callus both feet  Vascular  Dorsalis pedis and posterior tibial pulses are palpable  B/L.  Capillary return  WNL.  Temperature gradient is  WNL.  Skin turgor  WNL  Sensorium  Senn Weinstein monofilament wire  WNL. Normal tactile sensation.  Nail Exam  Patient has normal nails with no evidence of bacterial or fungal infection.  Orthopedic  Exam  Muscle tone and muscle strength  WNL.  No limitations of motion feet  B/L.  No crepitus or joint effusion noted.  HAV 1st MPJ  B/L.Hammer toe 5th  B/L.  Skin  No open lesions.  Normal skin texture and turgor. Asymptomatic  HD 5th  B/L.  Porokeratosis sub hallux left and sub 1 left foot.  Porokeratosis sub 5th right foot.  Porokeratosis  B/L.  IE>  Debride porokeratosis  B/L.  Told to use pumice stone as needed.  Gardiner Barefoot DPM

## 2019-04-11 DIAGNOSIS — K219 Gastro-esophageal reflux disease without esophagitis: Secondary | ICD-10-CM | POA: Diagnosis not present

## 2019-04-11 DIAGNOSIS — R7309 Other abnormal glucose: Secondary | ICD-10-CM | POA: Diagnosis not present

## 2019-04-11 DIAGNOSIS — I10 Essential (primary) hypertension: Secondary | ICD-10-CM | POA: Diagnosis not present

## 2019-04-11 DIAGNOSIS — Z125 Encounter for screening for malignant neoplasm of prostate: Secondary | ICD-10-CM | POA: Diagnosis not present

## 2019-04-11 DIAGNOSIS — E785 Hyperlipidemia, unspecified: Secondary | ICD-10-CM | POA: Diagnosis not present

## 2019-04-16 DIAGNOSIS — E785 Hyperlipidemia, unspecified: Secondary | ICD-10-CM | POA: Diagnosis not present

## 2019-04-16 DIAGNOSIS — R7309 Other abnormal glucose: Secondary | ICD-10-CM | POA: Diagnosis not present

## 2019-04-16 DIAGNOSIS — I10 Essential (primary) hypertension: Secondary | ICD-10-CM | POA: Diagnosis not present

## 2019-04-22 DIAGNOSIS — R3914 Feeling of incomplete bladder emptying: Secondary | ICD-10-CM | POA: Diagnosis not present

## 2019-04-22 DIAGNOSIS — N401 Enlarged prostate with lower urinary tract symptoms: Secondary | ICD-10-CM | POA: Diagnosis not present

## 2019-04-22 DIAGNOSIS — Z125 Encounter for screening for malignant neoplasm of prostate: Secondary | ICD-10-CM | POA: Diagnosis not present

## 2019-05-15 DIAGNOSIS — L298 Other pruritus: Secondary | ICD-10-CM | POA: Diagnosis not present

## 2019-06-10 IMAGING — DX DG CHEST 2V
2 series · 2 of 2 positions shown · non-contrast
Comparison: None.

CLINICAL DATA: Heart palpitations.

EXAM:
CHEST - 2 VIEW

[w chest pa]
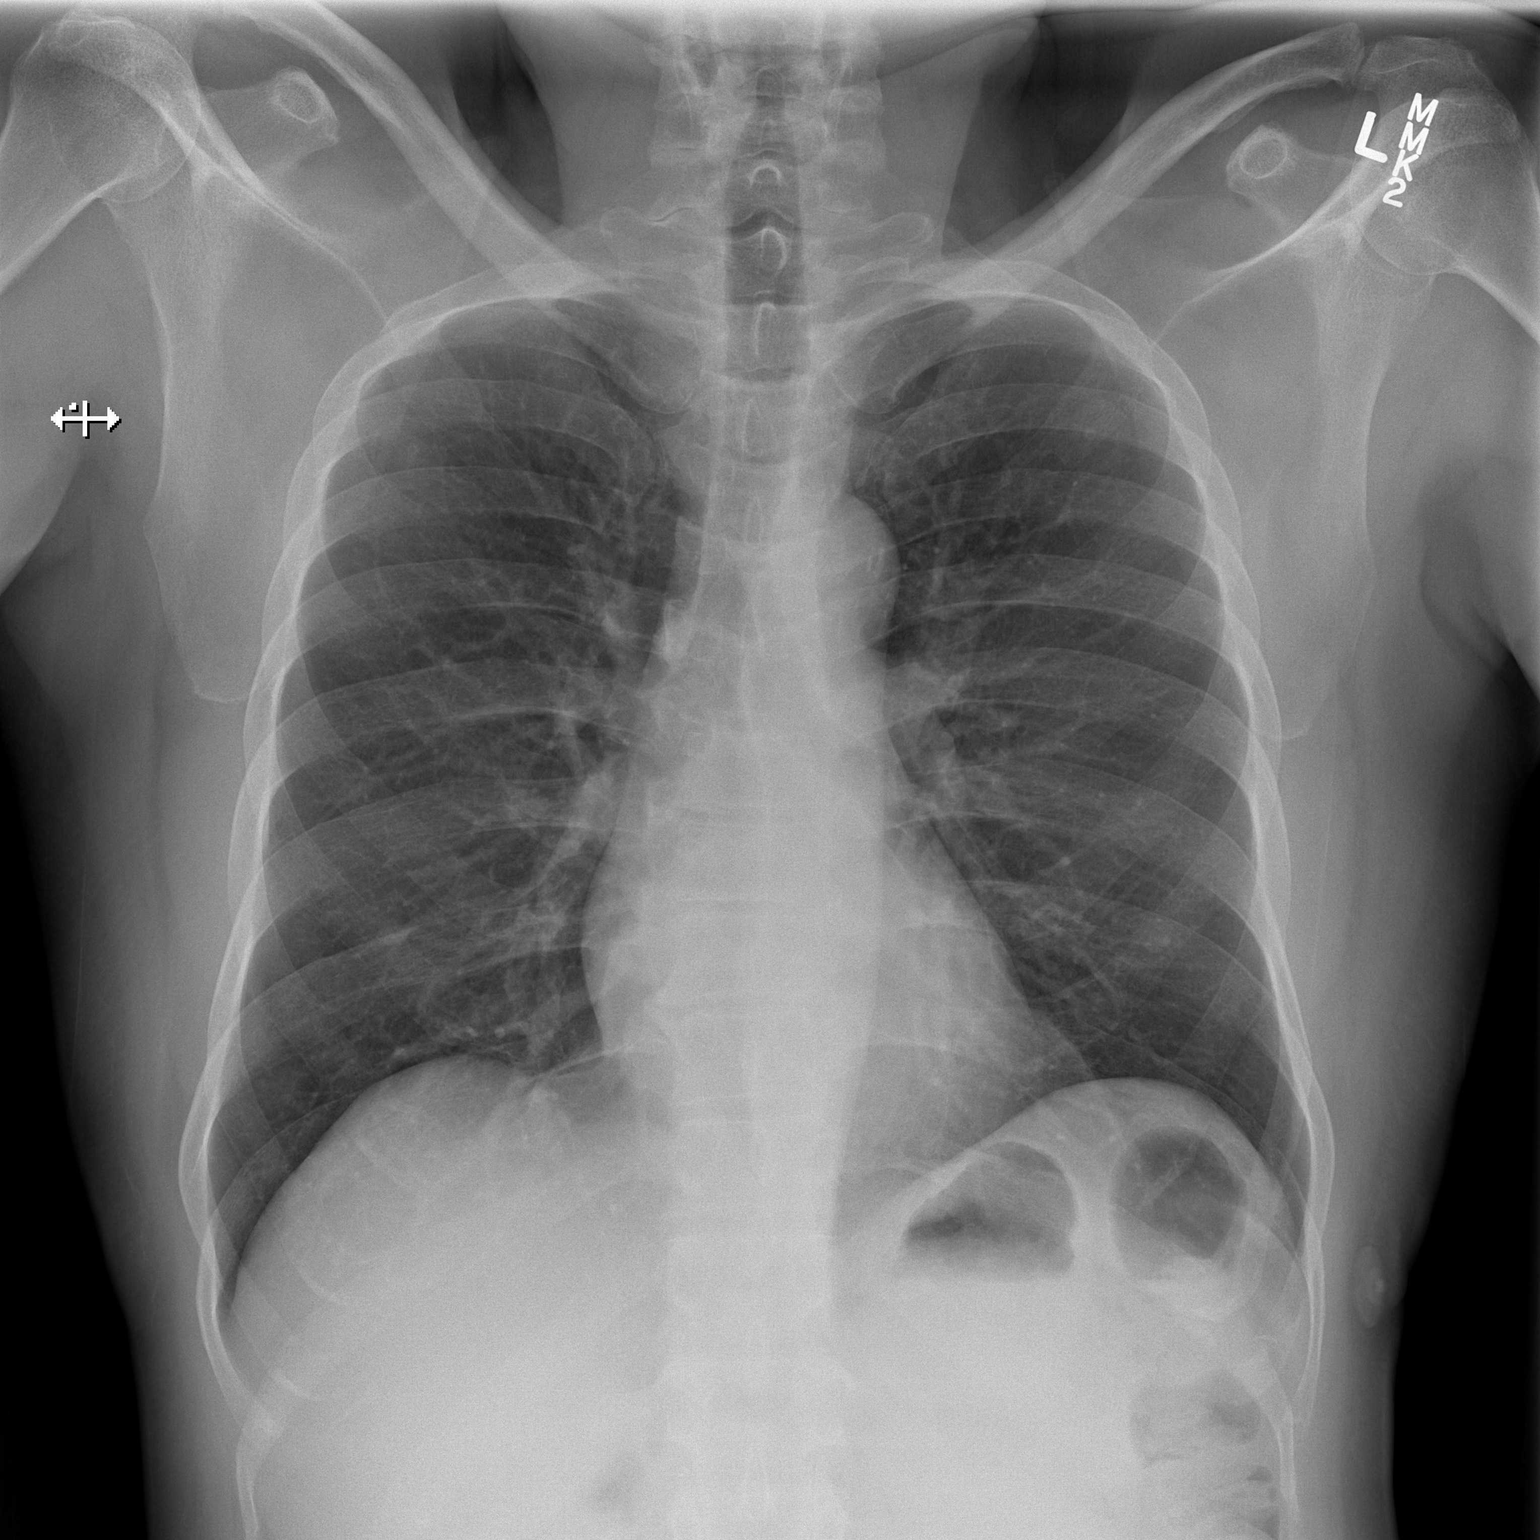

[w chest lat]
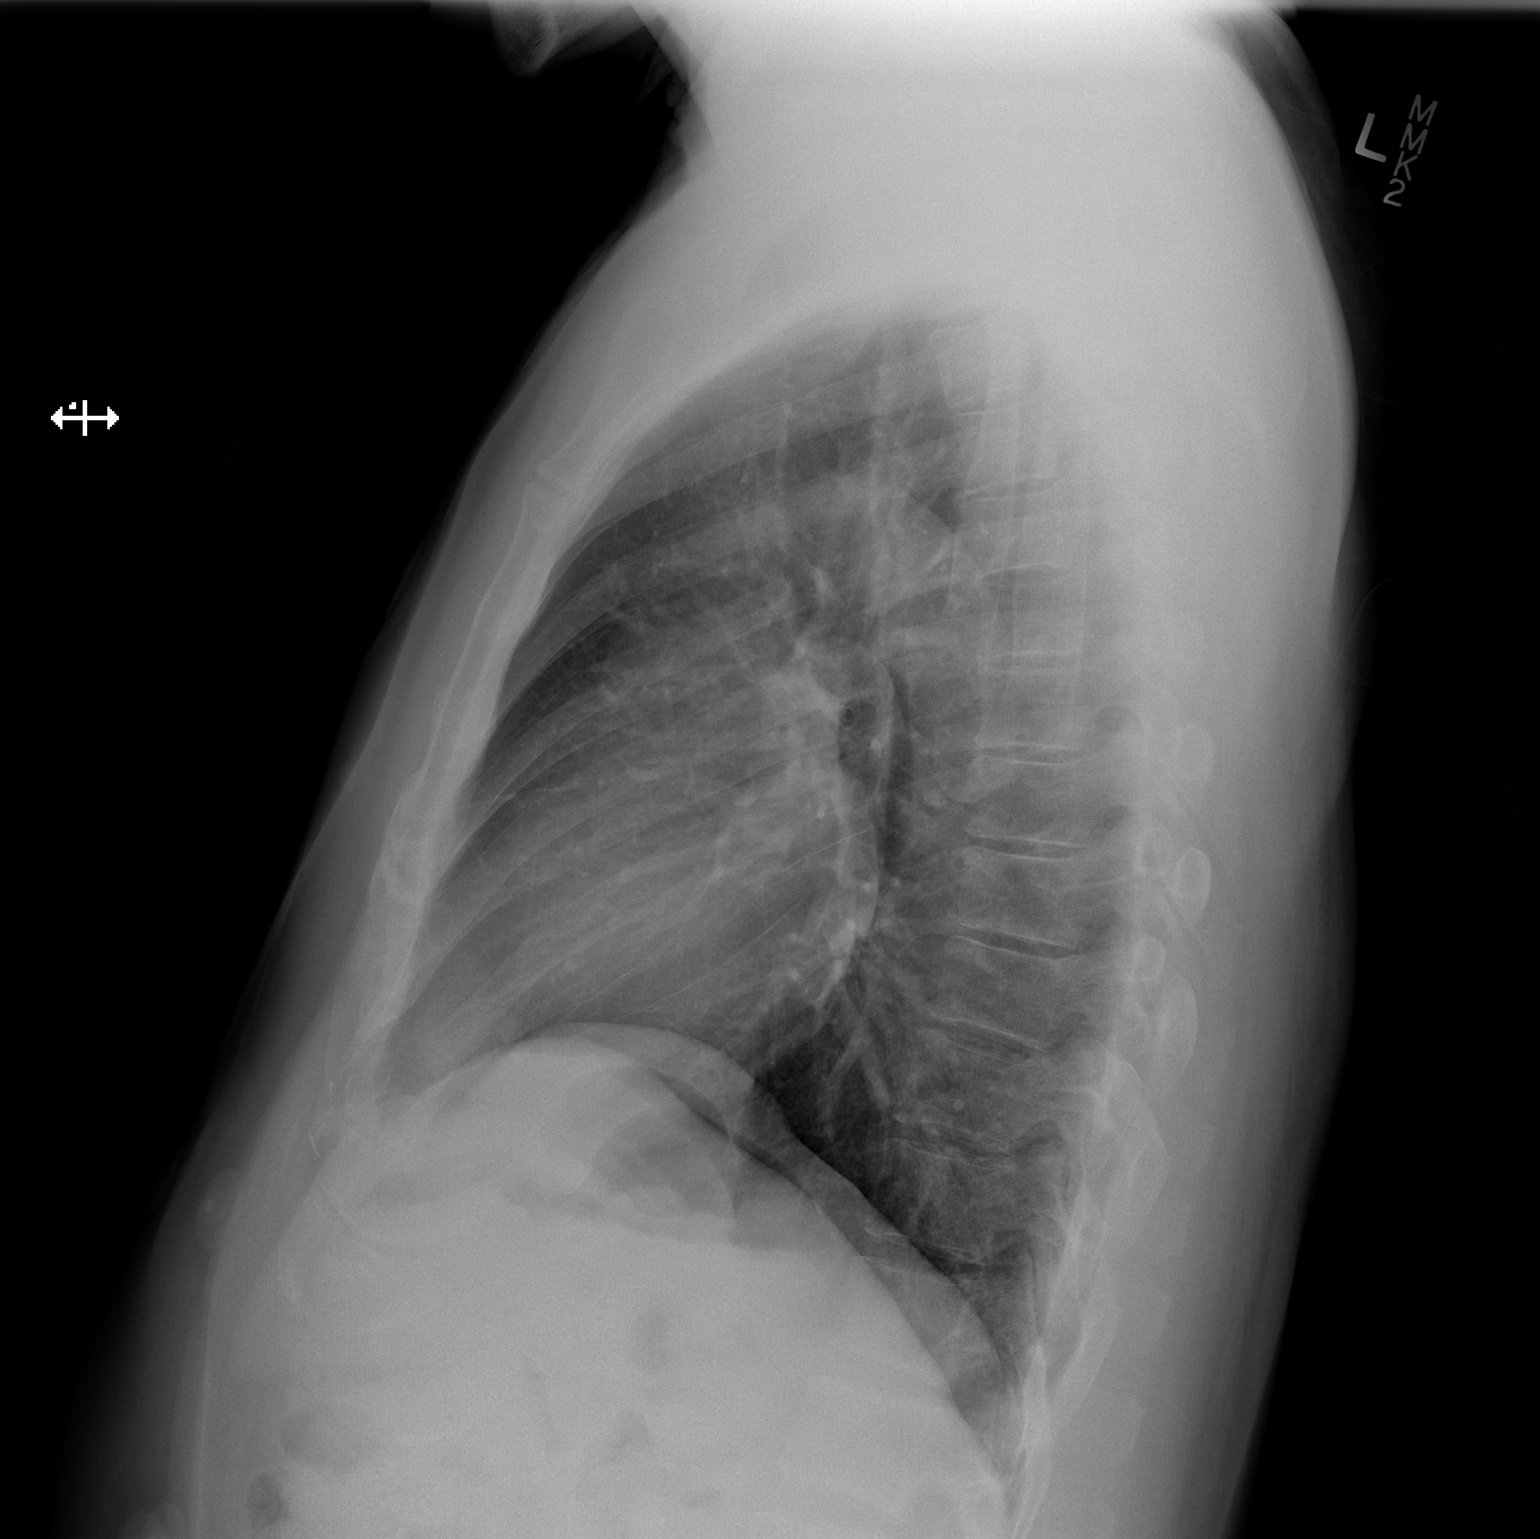

[2 of 2 positions shown; findings below may reference images not displayed]

FINDINGS: The heart size and mediastinal contours are within normal limits.
Both lungs are clear. The visualized skeletal structures are
unremarkable.
IMPRESSION: No active cardiopulmonary disease.

## 2019-08-06 DIAGNOSIS — N411 Chronic prostatitis: Secondary | ICD-10-CM | POA: Diagnosis not present

## 2019-08-06 DIAGNOSIS — N401 Enlarged prostate with lower urinary tract symptoms: Secondary | ICD-10-CM | POA: Diagnosis not present

## 2019-08-06 DIAGNOSIS — N342 Other urethritis: Secondary | ICD-10-CM | POA: Diagnosis not present

## 2019-08-06 DIAGNOSIS — N183 Chronic kidney disease, stage 3 (moderate): Secondary | ICD-10-CM | POA: Diagnosis not present

## 2019-08-06 DIAGNOSIS — N138 Other obstructive and reflux uropathy: Secondary | ICD-10-CM | POA: Diagnosis not present

## 2019-08-14 DIAGNOSIS — L298 Other pruritus: Secondary | ICD-10-CM | POA: Diagnosis not present

## 2019-08-14 DIAGNOSIS — L821 Other seborrheic keratosis: Secondary | ICD-10-CM | POA: Diagnosis not present

## 2019-08-15 DIAGNOSIS — R7309 Other abnormal glucose: Secondary | ICD-10-CM | POA: Diagnosis not present

## 2019-08-15 DIAGNOSIS — K219 Gastro-esophageal reflux disease without esophagitis: Secondary | ICD-10-CM | POA: Diagnosis not present

## 2019-08-15 DIAGNOSIS — I10 Essential (primary) hypertension: Secondary | ICD-10-CM | POA: Diagnosis not present

## 2019-08-15 DIAGNOSIS — E785 Hyperlipidemia, unspecified: Secondary | ICD-10-CM | POA: Diagnosis not present

## 2019-08-15 DIAGNOSIS — G622 Polyneuropathy due to other toxic agents: Secondary | ICD-10-CM | POA: Diagnosis not present

## 2019-08-19 DIAGNOSIS — I10 Essential (primary) hypertension: Secondary | ICD-10-CM | POA: Diagnosis not present

## 2019-08-21 DIAGNOSIS — N401 Enlarged prostate with lower urinary tract symptoms: Secondary | ICD-10-CM | POA: Diagnosis not present

## 2019-08-21 DIAGNOSIS — N411 Chronic prostatitis: Secondary | ICD-10-CM | POA: Diagnosis not present

## 2019-08-21 DIAGNOSIS — N138 Other obstructive and reflux uropathy: Secondary | ICD-10-CM | POA: Diagnosis not present

## 2019-09-23 DIAGNOSIS — I129 Hypertensive chronic kidney disease with stage 1 through stage 4 chronic kidney disease, or unspecified chronic kidney disease: Secondary | ICD-10-CM | POA: Diagnosis not present

## 2019-09-23 DIAGNOSIS — Q615 Medullary cystic kidney: Secondary | ICD-10-CM | POA: Diagnosis not present

## 2019-09-23 DIAGNOSIS — D631 Anemia in chronic kidney disease: Secondary | ICD-10-CM | POA: Diagnosis not present

## 2019-09-23 DIAGNOSIS — N182 Chronic kidney disease, stage 2 (mild): Secondary | ICD-10-CM | POA: Diagnosis not present

## 2019-09-23 DIAGNOSIS — M109 Gout, unspecified: Secondary | ICD-10-CM | POA: Diagnosis not present

## 2019-10-02 ENCOUNTER — Other Ambulatory Visit: Payer: Self-pay

## 2019-10-06 ENCOUNTER — Other Ambulatory Visit (INDEPENDENT_AMBULATORY_CARE_PROVIDER_SITE_OTHER): Payer: Medicare Other

## 2019-10-06 ENCOUNTER — Encounter: Payer: Self-pay | Admitting: Physician Assistant

## 2019-10-06 ENCOUNTER — Ambulatory Visit (INDEPENDENT_AMBULATORY_CARE_PROVIDER_SITE_OTHER): Payer: Medicare Other | Admitting: Physician Assistant

## 2019-10-06 VITALS — BP 120/78 | HR 104 | Temp 98.1°F | Ht 67.0 in | Wt 177.2 lb

## 2019-10-06 DIAGNOSIS — Z86010 Personal history of colonic polyps: Secondary | ICD-10-CM

## 2019-10-06 DIAGNOSIS — Z1159 Encounter for screening for other viral diseases: Secondary | ICD-10-CM | POA: Diagnosis not present

## 2019-10-06 DIAGNOSIS — D509 Iron deficiency anemia, unspecified: Secondary | ICD-10-CM

## 2019-10-06 LAB — IBC PANEL
Iron: 80 ug/dL (ref 42–165)
Saturation Ratios: 24.1 % (ref 20.0–50.0)
Transferrin: 237 mg/dL (ref 212.0–360.0)

## 2019-10-06 LAB — FERRITIN: Ferritin: 294.7 ng/mL (ref 22.0–322.0)

## 2019-10-06 NOTE — Patient Instructions (Signed)
If you are age 69 or older, your body mass index should be between 23-30. Your Body mass index is 27.75 kg/m. If this is out of the aforementioned range listed, please consider follow up with your Primary Care Provider.  If you are age 69 or younger, your body mass index should be between 19-25. Your Body mass index is 27.75 kg/m. If this is out of the aformentioned range listed, please consider follow up with your Primary Care Provider.   Your provider has requested that you go to the basement level for lab work before leaving today. Press "B" on the elevator. The lab is located at the first door on the left as you exit the elevator.   You have been scheduled for an endoscopy and colonoscopy. Please follow the written instructions given to you at your visit today. Please pick up your prep supplies at the pharmacy within the next 1-3 days. If you use inhalers (even only as needed), please bring them with you on the day of your procedure.

## 2019-10-06 NOTE — Progress Notes (Signed)
Chief Complaint: IDA  HPI:    Scott Graves is a 68 year old African-American male with past medical history as listed below, known to Dr. Silverio Decamp for anemia, who presents to clinic today with a complaint of dark stools.    08/15/2018 office visit with me.  At that time was discussed he had chronic anemia with multiple evaluations in the past.  At that time described 1 day of dark stool but did taken Pepto-Bismol beforehand.  At that time ordered CBC, CMP, iron studies including ferritin and Hemoccult cards x3.  Discussed that if work-up was normal/negative then would recommend he see a hematologist for further work-up of his anemia as he had never seen one in the past.  Labs returned with a normal hemoglobin, ferritin was slightly high.  That time no further work-up recommended.  At that time Dr. Silverio Decamp recommended that if he is Hemoccult positive then could consider repeat EGD and +/- small bowel video capsule.    08/15/2019 CBC with a hemoglobin of 11.9 and otherwise normal.  CMP normal.    Today, the patient presents to clinic and explains that he is still anemic.  Apparently his PCP is concerned about this.  Patient tells me that he is doing well, he is coming up due for his colonoscopy given his history of adenomatous polyps and he wants to know if we can go ahead and evaluate his anemia now.      Patient does tell me that he is feeling somewhat "cooped up due to Covid", and explains that they are unable to go visit his family in [place] for the holidays and all of this is getting to him.    Denies fever, chills, weight loss, heartburn, reflux, abdominal pain, change in bowel habits or symptoms that awaken him from sleep.  Previous GI history: Barium esophagogram was normal2012  Colonoscopy 01/2003: Small transverse colon polyp adenomatous polyp  Colonoscopy 2009 : mild internal hemorrhoids  Colonoscopy 2012: Four sessile polyps removed from descending, transverse and ascending colon    Colonoscopy 12/29/2014: Dimunitive Polyp removed from ascending colon, tubular adenoma, repeat recommended in 2021  EGD 2012: Small hiatal hernia with schatzki's ring, no dilation  Swallow study2016: mild pharyngeal dysphagia  Past Medical History:  Diagnosis Date  . Abnormal EKG   . Arthritis   . Benign hypertensive heart disease without HF (heart failure)   . Colon polyps   . GERD (gastroesophageal reflux disease)   . Gout   . Hyperlipemia   . Hypertension   . Kidney stones   . Palpitations     Past Surgical History:  Procedure Laterality Date  . CORONARY ANGIOPLASTY    . ELBOW BURSA SURGERY Right 09/2017    Current Outpatient Medications  Medication Sig Dispense Refill  . allopurinol (ZYLOPRIM) 300 MG tablet Take 300 mg by mouth daily.    . Cholecalciferol (VITAMIN D3) 2000 units TABS Take 2,000 Units by mouth daily.    . clotrimazole-betamethasone (LOTRISONE) cream APPLY CREAM TO AFFECTED AREA TWICE DAILY    . fluticasone (CUTIVATE) 0.05 % cream APPLY AA BID    . ketoconazole (NIZORAL) 2 % cream APPLY AA BID.    Marland Kitchen KLOR-CON M10 10 MEQ tablet     . MICARDIS HCT 40-12.5 MG tablet     . omeprazole (PRILOSEC) 40 MG capsule     . potassium chloride (K-DUR) 10 MEQ tablet Take 10 mEq by mouth daily.    . pregabalin (LYRICA) 150 MG capsule     .  pregabalin (LYRICA) 225 MG capsule Take 225 mg by mouth daily.    Marland Kitchen tolterodine (DETROL LA) 4 MG 24 hr capsule Take 4 mg by mouth daily.    Marland Kitchen ZETIA 10 MG tablet      No current facility-administered medications for this visit.     Allergies as of 10/06/2019 - Review Complete 04/08/2019  Allergen Reaction Noted  . Statins  06/08/2016    Family History  Problem Relation Age of Onset  . Hypertension Mother   . Diabetes Mother   . Hyperlipidemia Mother   . Hypertension Father   . Hypertension Sister   . Hyperlipidemia Sister   . Heart disease Sister   . Stroke Brother   . Seizures Brother   . Colon cancer Neg Hx    . Stomach cancer Neg Hx   . Esophageal cancer Neg Hx     Social History   Socioeconomic History  . Marital status: Married    Spouse name: Not on file  . Number of children: 1  . Years of education: HS  . Highest education level: Not on file  Occupational History  . Occupation: Retired  Scientific laboratory technician  . Financial resource strain: Not on file  . Food insecurity    Worry: Not on file    Inability: Not on file  . Transportation needs    Medical: Not on file    Non-medical: Not on file  Tobacco Use  . Smoking status: Never Smoker  . Smokeless tobacco: Never Used  Substance and Sexual Activity  . Alcohol use: Yes    Comment: Rarely  . Drug use: No  . Sexual activity: Not on file  Lifestyle  . Physical activity    Days per week: Not on file    Minutes per session: Not on file  . Stress: Not on file  Relationships  . Social Herbalist on phone: Not on file    Gets together: Not on file    Attends religious service: Not on file    Active member of club or organization: Not on file    Attends meetings of clubs or organizations: Not on file    Relationship status: Not on file  . Intimate partner violence    Fear of current or ex partner: Not on file    Emotionally abused: Not on file    Physically abused: Not on file    Forced sexual activity: Not on file  Other Topics Concern  . Not on file  Social History Narrative   Rare caffeine use   Denies abuse and feels safe at home.     Review of Systems:    Constitutional: No weight loss, fever or chills Cardiovascular: No chest pain  Respiratory: No SOB  Gastrointestinal: See HPI and otherwise negative Hematologic: No bleeding    Physical Exam:  Vital signs: BP 120/78 (BP Location: Left Arm, Patient Position: Sitting)   Pulse (!) 104   Temp 98.1 F (36.7 C)   Ht 5\' 7"  (1.702 m)   Wt 177 lb 3.2 oz (80.4 kg)   SpO2 96%   BMI 27.75 kg/m   Constitutional:   Pleasant AA male appears to be in NAD, Well  developed, Well nourished, alert and cooperative Respiratory: Respirations even and unlabored. Lungs clear to auscultation bilaterally.   No wheezes, crackles, or rhonchi.  Cardiovascular: Normal S1, S2. No MRG. Regular rate and rhythm. No peripheral edema, cyanosis or pallor.  Gastrointestinal:  Soft, nondistended, nontender.  No rebound or guarding. Normal bowel sounds. No appreciable masses or hepatomegaly. Rectal:  Not performed.  Psychiatric: Demonstrates good judgement and reason without abnormal affect or behaviors.  See Hpi for recent labs.  Assessment: 1.  IDA: Chronic for the patient with multiple evaluations in the past, hemoglobin is now down to 11, apparently concerning his PCP, no recent iron studies, last EGD in 2012 and last colonoscopy in 2016 with adenomatous polyps, repeat recommended in 5 years; consider GI source versus other  Plan: 1.  Went ahead and scheduled patient for an EGD and colonoscopy given his iron deficiency anemia and history of adenomatous polyps.  These were scheduled in the [facility] with Dr. Silverio Decamp.  I discussed risks, benefits, limitations and alternatives and patient agrees to proceed.  Patient will be Covid tested 2 days prior to time of procedures. 2.  Ordered iron studies with ferritin today 3.  Patient to follow in clinic per recommendations from Dr. Silverio Decamp after time of procedures.  Ellouise Newer, PA-C [facility] 10/06/2019, 1:56 PM  Cc: Scott Lei, MD

## 2019-10-08 DIAGNOSIS — N138 Other obstructive and reflux uropathy: Secondary | ICD-10-CM | POA: Diagnosis not present

## 2019-10-08 DIAGNOSIS — L918 Other hypertrophic disorders of the skin: Secondary | ICD-10-CM | POA: Diagnosis not present

## 2019-10-08 DIAGNOSIS — Z0000 Encounter for general adult medical examination without abnormal findings: Secondary | ICD-10-CM | POA: Diagnosis not present

## 2019-10-08 DIAGNOSIS — K219 Gastro-esophageal reflux disease without esophagitis: Secondary | ICD-10-CM | POA: Diagnosis not present

## 2019-10-08 DIAGNOSIS — N289 Disorder of kidney and ureter, unspecified: Secondary | ICD-10-CM | POA: Diagnosis not present

## 2019-10-08 DIAGNOSIS — N401 Enlarged prostate with lower urinary tract symptoms: Secondary | ICD-10-CM | POA: Diagnosis not present

## 2019-10-08 DIAGNOSIS — N411 Chronic prostatitis: Secondary | ICD-10-CM | POA: Diagnosis not present

## 2019-10-08 DIAGNOSIS — I10 Essential (primary) hypertension: Secondary | ICD-10-CM | POA: Diagnosis not present

## 2019-10-08 DIAGNOSIS — E559 Vitamin D deficiency, unspecified: Secondary | ICD-10-CM | POA: Diagnosis not present

## 2019-10-08 DIAGNOSIS — E782 Mixed hyperlipidemia: Secondary | ICD-10-CM | POA: Diagnosis not present

## 2019-10-08 DIAGNOSIS — M109 Gout, unspecified: Secondary | ICD-10-CM | POA: Diagnosis not present

## 2019-10-08 DIAGNOSIS — R202 Paresthesia of skin: Secondary | ICD-10-CM | POA: Diagnosis not present

## 2019-10-13 ENCOUNTER — Telehealth: Payer: Self-pay | Admitting: Physician Assistant

## 2019-10-13 MED ORDER — NA SULFATE-K SULFATE-MG SULF 17.5-3.13-1.6 GM/177ML PO SOLN: 1.0000 | Freq: Once | ORAL | 0 refills | Status: AC

## 2019-10-13 NOTE — Progress Notes (Signed)
Reviewed and agree with documentation and assessment and plan. K. Veena Tasha Jindra , MD   

## 2019-10-13 NOTE — Addendum Note (Signed)
Addended by: Lanny Hurst A on: 10/13/2019 01:36 PM   Modules accepted: Orders

## 2019-10-13 NOTE — Telephone Encounter (Signed)
Sent suprep to patients pharmacy

## 2019-10-15 DIAGNOSIS — I10 Essential (primary) hypertension: Secondary | ICD-10-CM | POA: Diagnosis not present

## 2019-10-15 DIAGNOSIS — Z131 Encounter for screening for diabetes mellitus: Secondary | ICD-10-CM | POA: Diagnosis not present

## 2019-10-15 DIAGNOSIS — E782 Mixed hyperlipidemia: Secondary | ICD-10-CM | POA: Diagnosis not present

## 2019-10-15 DIAGNOSIS — M109 Gout, unspecified: Secondary | ICD-10-CM | POA: Diagnosis not present

## 2019-10-15 DIAGNOSIS — E559 Vitamin D deficiency, unspecified: Secondary | ICD-10-CM | POA: Diagnosis not present

## 2019-10-15 DIAGNOSIS — R202 Paresthesia of skin: Secondary | ICD-10-CM | POA: Diagnosis not present

## 2019-10-20 ENCOUNTER — Ambulatory Visit (INDEPENDENT_AMBULATORY_CARE_PROVIDER_SITE_OTHER): Payer: Medicare Other

## 2019-10-20 ENCOUNTER — Other Ambulatory Visit: Payer: Self-pay | Admitting: Gastroenterology

## 2019-10-20 DIAGNOSIS — Z1159 Encounter for screening for other viral diseases: Secondary | ICD-10-CM | POA: Diagnosis not present

## 2019-10-21 LAB — SARS CORONAVIRUS 2 (TAT 6-24 HRS): SARS Coronavirus 2: NEGATIVE

## 2019-10-22 ENCOUNTER — Other Ambulatory Visit: Payer: Self-pay

## 2019-10-22 ENCOUNTER — Encounter: Payer: Self-pay | Admitting: Gastroenterology

## 2019-10-22 ENCOUNTER — Ambulatory Visit (AMBULATORY_SURGERY_CENTER): Payer: Medicare Other | Admitting: Gastroenterology

## 2019-10-22 VITALS — BP 91/63 | HR 77 | Temp 97.5°F | Resp 12 | Ht 67.0 in | Wt 177.0 lb

## 2019-10-22 DIAGNOSIS — Z86010 Personal history of colonic polyps: Secondary | ICD-10-CM | POA: Diagnosis not present

## 2019-10-22 DIAGNOSIS — K2950 Unspecified chronic gastritis without bleeding: Secondary | ICD-10-CM | POA: Diagnosis not present

## 2019-10-22 DIAGNOSIS — D120 Benign neoplasm of cecum: Secondary | ICD-10-CM

## 2019-10-22 DIAGNOSIS — K3189 Other diseases of stomach and duodenum: Secondary | ICD-10-CM | POA: Diagnosis not present

## 2019-10-22 DIAGNOSIS — D509 Iron deficiency anemia, unspecified: Secondary | ICD-10-CM | POA: Diagnosis not present

## 2019-10-22 DIAGNOSIS — D122 Benign neoplasm of ascending colon: Secondary | ICD-10-CM

## 2019-10-22 DIAGNOSIS — K2980 Duodenitis without bleeding: Secondary | ICD-10-CM | POA: Diagnosis not present

## 2019-10-22 DIAGNOSIS — D123 Benign neoplasm of transverse colon: Secondary | ICD-10-CM | POA: Diagnosis not present

## 2019-10-22 DIAGNOSIS — D125 Benign neoplasm of sigmoid colon: Secondary | ICD-10-CM

## 2019-10-22 MED ORDER — SODIUM CHLORIDE 0.9 % IV SOLN: 500.0000 mL | Freq: Once | INTRAVENOUS | Status: DC

## 2019-10-22 NOTE — Progress Notes (Signed)
PT taken to PACU. Monitors in place. VSS. Report given to RN. 

## 2019-10-22 NOTE — Patient Instructions (Signed)
Handout on polyps given.   YOU HAD AN ENDOSCOPIC PROCEDURE TODAY AT [facility]:   Refer to the procedure report that was given to you for any specific questions about what was found during the examination.  If the procedure report does not answer your questions, please call your gastroenterologist to clarify.  If you requested that your care partner not be given the details of your procedure findings, then the procedure report has been included in a sealed envelope for you to review at your convenience later.  YOU SHOULD EXPECT: Some feelings of bloating in the abdomen. Passage of more gas than usual.  Walking can help get rid of the air that was put into your GI tract during the procedure and reduce the bloating. If you had a lower endoscopy (such as a colonoscopy or flexible sigmoidoscopy) you may notice spotting of blood in your stool or on the toilet paper. If you underwent a bowel prep for your procedure, you may not have a normal bowel movement for a few days.  Please Note:  You might notice some irritation and congestion in your nose or some drainage.  This is from the oxygen used during your procedure.  There is no need for concern and it should clear up in a day or so.  SYMPTOMS TO REPORT IMMEDIATELY:   Following lower endoscopy (colonoscopy or flexible sigmoidoscopy):  Excessive amounts of blood in the stool  Significant tenderness or worsening of abdominal pains  Swelling of the abdomen that is new, acute  Fever of 100F or higher   Following upper endoscopy (EGD)  Vomiting of blood or coffee ground material  New chest pain or pain under the shoulder blades  Painful or persistently difficult swallowing  New shortness of breath  Fever of 100F or higher  Black, tarry-looking stools  For urgent or emergent issues, a gastroenterologist can be reached at any hour by calling [phone].   DIET:  We do recommend a small meal at first, but then you may proceed  to your regular diet.  Drink plenty of fluids but you should avoid alcoholic beverages for 24 hours.  ACTIVITY:  You should plan to take it easy for the rest of today and you should NOT DRIVE or use heavy machinery until tomorrow (because of the sedation medicines used during the test).    FOLLOW UP: Our staff will call the number listed on your records 48-72 hours following your procedure to check on you and address any questions or concerns that you may have regarding the information given to you following your procedure. If we do not reach you, we will leave a message.  We will attempt to reach you two times.  During this call, we will ask if you have developed any symptoms of COVID 19. If you develop any symptoms (ie: fever, flu-like symptoms, shortness of breath, cough etc.) before then, please call [phone].  If you test positive for Covid 19 in the 2 weeks post procedure, please call and report this information to [place].    If any biopsies were taken you will be contacted by phone or by letter within the next 1-3 weeks.  Please call us at [phone] if you have not heard about the biopsies in 3 weeks.    SIGNATURES/CONFIDENTIALITY: You and/or your care partner have signed paperwork which will be entered into your electronic medical record.  These signatures attest to the fact that that the information above on your After Visit  Summary has been reviewed and is understood.  Full responsibility of the confidentiality of this discharge information lies with you and/or your care-partner. 

## 2019-10-22 NOTE — Progress Notes (Signed)
JB - [facility] DT - VS  Pt wears upper partial, but it is at home.  No loose teeth per pt. maw

## 2019-10-22 NOTE — Op Note (Signed)
[facility] Patient Name: Scott Graves Procedure Date: 10/22/2019 10:12 AM MRN: [id number] Endoscopist: Mauri Pole , MD Age: 68 Referring MD:  Date of Birth: 03/11/1951 Gender: Male Account #: [id number] Procedure:                Upper GI endoscopy Indications:              Suspected upper gastrointestinal bleeding in                            patient with unexplained iron deficiency anemia Medicines:                Monitored Anesthesia Care Procedure:                Pre-Anesthesia Assessment:                           - Prior to the procedure, a History and Physical                            was performed, and patient medications and                            allergies were reviewed. The patient's tolerance of                            previous anesthesia was also reviewed. The risks                            and benefits of the procedure and the sedation                            options and risks were discussed with the patient.                            All questions were answered, and informed consent                            was obtained. Prior Anticoagulants: The patient has                            taken no previous anticoagulant or antiplatelet                            agents. ASA Grade Assessment: II - A patient with                            mild systemic disease. After reviewing the risks                            and benefits, the patient was deemed in                            satisfactory condition to undergo the procedure.  After obtaining informed consent, the endoscope was                            passed under direct vision. Throughout the                            procedure, the patient's blood pressure, pulse, and                            oxygen saturations were monitored continuously. The                            Endoscope was introduced through the mouth, and                            advanced  to the second part of duodenum. The upper                            GI endoscopy was accomplished without difficulty.                            The patient tolerated the procedure well. Scope In: Scope Out: Findings:                 The Z-line was regular and was found 38 cm from the                            incisors.                           No gross lesions were noted in the entire esophagus.                           Patchy mild inflammation characterized by adherent                            blood, congestion (edema), erythema and mucus was                            found in the entire examined stomach. Biopsies were                            taken with a cold forceps for Helicobacter pylori                            testing.                           Patchy mildly erythematous mucosa without active                            bleeding was found in the duodenal bulb and in the                            second  portion of the duodenum. Biopsies for                            histology were taken with a cold forceps for                            evaluation of celiac disease. Complications:            No immediate complications. Estimated Blood Loss:     Estimated blood loss was minimal. Impression:               - Z-line regular, 38 cm from the incisors.                           - No gross lesions in esophagus.                           - Gastritis. Biopsied.                           - Erythematous duodenopathy. Biopsied. Recommendation:           - Patient has a contact number available for                            emergencies. The signs and symptoms of potential                            delayed complications were discussed with the                            patient. Return to normal activities tomorrow.                            Written discharge instructions were provided to the                            patient.                           - Resume previous diet.                            - Continue present medications.                           - Await pathology results.                           - See the other procedure note for documentation of                            additional recommendations. Mauri Pole, MD 10/22/2019 10:57:39 AM This report has been signed electronically.

## 2019-10-22 NOTE — Progress Notes (Signed)
Called to room to assist during endoscopic procedure.  Patient ID and intended procedure confirmed with present staff. Received instructions for my participation in the procedure from the performing physician.  

## 2019-10-22 NOTE — Op Note (Signed)
[facility] Patient Name: Scott Graves Procedure Date: 10/22/2019 10:12 AM MRN: [id number] Endoscopist: Mauri Pole , MD Age: 68 Referring MD:  Date of Birth: 01/07/1951 Gender: Male Account #: [id number] Procedure:                Colonoscopy Indications:              High risk colon cancer surveillance: Personal                            history of colonic polyps Medicines:                Monitored Anesthesia Care Procedure:                Pre-Anesthesia Assessment:                           - Prior to the procedure, a History and Physical                            was performed, and patient medications and                            allergies were reviewed. The patient's tolerance of                            previous anesthesia was also reviewed. The risks                            and benefits of the procedure and the sedation                            options and risks were discussed with the patient.                            All questions were answered, and informed consent                            was obtained. Prior Anticoagulants: The patient has                            taken no previous anticoagulant or antiplatelet                            agents. ASA Grade Assessment: II - A patient with                            mild systemic disease. After reviewing the risks                            and benefits, the patient was deemed in                            satisfactory condition to undergo the procedure.  After obtaining informed consent, the colonoscope                            was passed under direct vision. Throughout the                            procedure, the patient's blood pressure, pulse, and                            oxygen saturations were monitored continuously. The                            Colonoscope was introduced through the anus and                            advanced to the the cecum,  identified by                            appendiceal orifice and ileocecal valve. The                            colonoscopy was performed without difficulty. The                            patient tolerated the procedure well. The quality                            of the bowel preparation was good. The ileocecal                            valve, appendiceal orifice, and rectum were                            photographed. Scope In: 10:31:04 AM Scope Out: 10:53:59 AM Scope Withdrawal Time: 0 hours 15 minutes 14 seconds  Total Procedure Duration: 0 hours 22 minutes 55 seconds  Findings:                 The perianal and digital rectal examinations were                            normal.                           Two sessile polyps were found in the sigmoid colon                            and cecum. The polyps were 1 to 2 mm in size. These                            polyps were removed with a cold biopsy forceps.                            Resection and retrieval were complete.  Five sessile polyps were found in the transverse                            colon and ascending colon. The polyps were 3 to 7                            mm in size. These polyps were removed with a cold                            snare. Resection and retrieval were complete.                           Non-bleeding internal hemorrhoids were found during                            retroflexion. The hemorrhoids were small. Complications:            No immediate complications. Estimated Blood Loss:     Estimated blood loss was minimal. Impression:               - Two 1 to 2 mm polyps in the sigmoid colon and in                            the cecum, removed with a cold biopsy forceps.                            Resected and retrieved.                           - Five 3 to 7 mm polyps in the transverse colon and                            in the ascending colon, removed with a cold snare.                             Resected and retrieved.                           - Non-bleeding internal hemorrhoids. Recommendation:           - Patient has a contact number available for                            emergencies. The signs and symptoms of potential                            delayed complications were discussed with the                            patient. Return to normal activities tomorrow.                            Written discharge instructions were provided to the  patient.                           - Resume previous diet.                           - Continue present medications.                           - Await pathology results.                           - Repeat colonoscopy in 3 - 5 years for                            surveillance based on pathology results. Mauri Pole, MD 10/22/2019 11:00:14 AM This report has been signed electronically.

## 2019-10-23 ENCOUNTER — Telehealth: Payer: Self-pay | Admitting: Cardiovascular Disease

## 2019-10-23 DIAGNOSIS — L738 Other specified follicular disorders: Secondary | ICD-10-CM | POA: Diagnosis not present

## 2019-10-23 NOTE — Telephone Encounter (Signed)
° ° °  Patient calling to update procedures in medical record under past surgical history. Patient states he did not have services listed

## 2019-10-24 ENCOUNTER — Encounter: Payer: Self-pay | Admitting: Gastroenterology

## 2019-10-24 NOTE — Telephone Encounter (Signed)
Covid-19 screening questions   Do you now or have you had a fever in the last 14 days? No.  Do you have any respiratory symptoms of shortness of breath or cough now or in the last 14 days? No.  Do you have any family members or close contacts with diagnosed or suspected Covid-19 in the past 14 days? No.  Have you been tested for Covid-19 and found to be positive? No.    Called pt. For follow-up following endoscopic procedure 10/22/2019.  Pt. Reports he has returned back to his diet and activities, and is doing well.

## 2019-10-28 ENCOUNTER — Telehealth: Payer: Self-pay

## 2019-10-28 ENCOUNTER — Other Ambulatory Visit: Payer: Self-pay

## 2019-10-28 MED ORDER — PANTOPRAZOLE SODIUM 40 MG PO TBEC: 40.0000 mg | DELAYED_RELEASE_TABLET | Freq: Every day | ORAL | 3 refills | Status: DC

## 2019-10-28 NOTE — Telephone Encounter (Signed)
Sent order to patient's pharmacy for Pantograzole-40mg  daily before breakfast. Called patient and left message on voice mail that it was sent and instructions for taking. Asked him to call if any questions

## 2019-10-28 NOTE — Telephone Encounter (Signed)
Can switch to Pantoprazole 40 mg qd instead. Thanks-JLL

## 2019-10-28 NOTE — Telephone Encounter (Signed)
Called patient and he says he has been on Omperazole for 20 years. Initially 20 mg and then at some point it was increased to 40mg  daily. States if helps some but he is still having reflux symptoms where food/liquid comes up in his throat, esp. In the morning. Says he sleeps elevated and has no burning sensation

## 2019-11-05 ENCOUNTER — Telehealth: Payer: Self-pay | Admitting: Cardiovascular Disease

## 2019-11-05 NOTE — Telephone Encounter (Signed)
[facility] received an amendment request from this patient. Sending over to [facility], Dr. Johnsie Cancel for his review. 11/05/19  KLM

## 2019-11-11 DIAGNOSIS — H2513 Age-related nuclear cataract, bilateral: Secondary | ICD-10-CM | POA: Diagnosis not present

## 2019-11-11 DIAGNOSIS — H40053 Ocular hypertension, bilateral: Secondary | ICD-10-CM | POA: Diagnosis not present

## 2019-11-11 DIAGNOSIS — H1013 Acute atopic conjunctivitis, bilateral: Secondary | ICD-10-CM | POA: Diagnosis not present

## 2019-12-15 ENCOUNTER — Other Ambulatory Visit: Payer: Self-pay

## 2019-12-15 MED ORDER — PANTOPRAZOLE SODIUM 40 MG PO TBEC: 40.0000 mg | DELAYED_RELEASE_TABLET | Freq: Every day | ORAL | 3 refills | Status: DC

## 2020-01-09 DIAGNOSIS — N411 Chronic prostatitis: Secondary | ICD-10-CM | POA: Diagnosis not present

## 2020-01-09 DIAGNOSIS — M109 Gout, unspecified: Secondary | ICD-10-CM | POA: Diagnosis not present

## 2020-01-09 DIAGNOSIS — E559 Vitamin D deficiency, unspecified: Secondary | ICD-10-CM | POA: Diagnosis not present

## 2020-01-09 DIAGNOSIS — N182 Chronic kidney disease, stage 2 (mild): Secondary | ICD-10-CM | POA: Diagnosis not present

## 2020-01-09 DIAGNOSIS — K219 Gastro-esophageal reflux disease without esophagitis: Secondary | ICD-10-CM | POA: Diagnosis not present

## 2020-01-09 DIAGNOSIS — E782 Mixed hyperlipidemia: Secondary | ICD-10-CM | POA: Diagnosis not present

## 2020-01-09 DIAGNOSIS — Z789 Other specified health status: Secondary | ICD-10-CM | POA: Diagnosis not present

## 2020-01-09 DIAGNOSIS — R7303 Prediabetes: Secondary | ICD-10-CM | POA: Diagnosis not present

## 2020-01-09 DIAGNOSIS — I129 Hypertensive chronic kidney disease with stage 1 through stage 4 chronic kidney disease, or unspecified chronic kidney disease: Secondary | ICD-10-CM | POA: Diagnosis not present

## 2020-01-09 DIAGNOSIS — N138 Other obstructive and reflux uropathy: Secondary | ICD-10-CM | POA: Diagnosis not present

## 2020-01-09 DIAGNOSIS — R202 Paresthesia of skin: Secondary | ICD-10-CM | POA: Diagnosis not present

## 2020-01-09 DIAGNOSIS — N401 Enlarged prostate with lower urinary tract symptoms: Secondary | ICD-10-CM | POA: Diagnosis not present

## 2020-01-27 ENCOUNTER — Ambulatory Visit (INDEPENDENT_AMBULATORY_CARE_PROVIDER_SITE_OTHER): Payer: Medicare Other

## 2020-01-27 ENCOUNTER — Ambulatory Visit (INDEPENDENT_AMBULATORY_CARE_PROVIDER_SITE_OTHER): Payer: Medicare Other | Admitting: Orthopaedic Surgery

## 2020-01-27 ENCOUNTER — Encounter: Payer: Self-pay | Admitting: Orthopaedic Surgery

## 2020-01-27 ENCOUNTER — Other Ambulatory Visit: Payer: Self-pay

## 2020-01-27 VITALS — Ht 67.0 in | Wt 179.0 lb

## 2020-01-27 DIAGNOSIS — M7542 Impingement syndrome of left shoulder: Secondary | ICD-10-CM | POA: Diagnosis not present

## 2020-01-27 DIAGNOSIS — M25512 Pain in left shoulder: Secondary | ICD-10-CM

## 2020-01-27 DIAGNOSIS — G8929 Other chronic pain: Secondary | ICD-10-CM

## 2020-01-27 MED ORDER — BUPIVACAINE HCL 0.25 % IJ SOLN
2.0000 mL | INTRAMUSCULAR | Status: AC | PRN
  Administered 2020-01-27: 2 mL via INTRA_ARTICULAR

## 2020-01-27 MED ORDER — LIDOCAINE HCL 1 % IJ SOLN
2.0000 mL | INTRAMUSCULAR | Status: AC | PRN
  Administered 2020-01-27: 2 mL

## 2020-01-27 MED ORDER — METHYLPREDNISOLONE ACETATE 40 MG/ML IJ SUSP
80.0000 mg | INTRAMUSCULAR | Status: AC | PRN
  Administered 2020-01-27: 80 mg via INTRA_ARTICULAR

## 2020-01-27 NOTE — Progress Notes (Signed)
Office Visit Note   Patient: Scott Graves           Date of Birth: [birth year]           MRN: [id number] Visit Date: 01/27/2020              Requested by: Lucianne Lei, MD [address] [place],  [place] [address] PCP: Lucianne Lei, MD   Assessment & Plan: Visit Diagnoses:  1. Chronic left shoulder pain   2. Impingement syndrome of left shoulder     Plan:  #1: Subacromial corticosteroid injection was given to the left shoulder out through a lateral approach.  This was done atraumatically.  He had good improvement postinjection. #2: If he continues to have good results and no further treatment.  However if his symptoms are not improved or worsen he will call and we will schedule him for an MRI scan of the left shoulder to rule out a rotator cuff tear.  He is understanding  Follow-Up Instructions: Return if symptoms worsen or fail to improve, for call for MRI scan if symptoms continue.   Orders:  Orders Placed This Encounter  Procedures  . XR Shoulder Left   No orders of the defined types were placed in this encounter.     Procedures: Large Joint Inj: L subacromial bursa on 01/27/2020 4:24 PM Indications: pain and diagnostic evaluation Details: 25 G 1.5 in needle, anterolateral approach  Arthrogram: No  Medications: 80 mg methylPREDNISolone acetate 40 MG/ML; 2 mL lidocaine 1 %; 2 mL bupivacaine 0.25 % Outcome: tolerated well, no immediate complications Procedure, treatment alternatives, risks and benefits explained, specific risks discussed. Consent was given by the patient. Immediately prior to procedure a time out was called to verify the correct patient, procedure, equipment, support staff and site/side marked as required. Patient was prepped and draped in the usual sterile fashion.       Clinical Data: No additional findings.   Subjective: Chief Complaint  Patient presents with  . Left Shoulder - Pain   HPI Patient presents today for left shoulder pain  that started a few weeks ago. No known injury. Most of his pain is located in the superior shoulder. The pain is better when up and about, but worse at night when he lays down. No numbness, tingling, or weakness in his left arm. No previous left shoulder surgery. He states that his range of motion is pretty good and he stretches it each day. He is left hand dominant.    Review of Systems  Constitutional: Negative for fatigue.  HENT: Negative for ear pain.   Eyes: Negative for pain.  Respiratory: Negative for shortness of breath.   Cardiovascular: Negative for leg swelling.  Gastrointestinal: Negative for constipation and diarrhea.  Endocrine: Negative for cold intolerance and heat intolerance.  Genitourinary: Negative for difficulty urinating.  Musculoskeletal: Negative for joint swelling.  Skin: Negative for rash.  Allergic/Immunologic: Negative for food allergies.  Neurological: Negative for weakness.  Hematological: Does not bruise/bleed easily.  Psychiatric/Behavioral: Negative for sleep disturbance.     Objective: Vital Signs: Ht 5\' 7"  (1.702 m)   Wt 179 lb (81.2 kg)   BMI 28.04 kg/m   Physical Exam Constitutional:      Appearance: Normal appearance. He is well-developed and normal weight.  HENT:     Head: Normocephalic.  Eyes:     Pupils: Pupils are equal, round, and reactive to light.  Pulmonary:     Effort: Pulmonary effort is normal.  Skin:    General: Skin is warm and dry.  Neurological:     Mental Status: He is alert and oriented to person, place, and time.  Psychiatric:        Behavior: Behavior normal.     Ortho Exam  Exam today reveals little bit of tenderness over the [facility] joint.  He does have some mild crepitance occasionally with  the range of motion of the shoulder.  His symptoms worsen at the extremes of the range of motion.  Positive empty can test at 90 degrees of abduction as well as 90 degrees of forward flexion.  He has good strength though out with  testing bilateral and equally..  Not much pain referred to his shoulder from range of motion of his neck.  Specialty Comments:  No specialty comments available.  Imaging: XR Shoulder Left  Result Date: 01/27/2020 Three-view x-ray of the left shoulder reveals a type I-II acromion.  Some degenerative changes of the [facility] joint with little bit of  an inferior spur noted on the distal clavicle.    PMFS History: Current Outpatient Medications  Medication Sig Dispense Refill  . allopurinol (ZYLOPRIM) 300 MG tablet Take 300 mg by mouth daily.    . Cholecalciferol (VITAMIN D3) 2000 units TABS Take 2,000 Units by mouth daily.    . clotrimazole-betamethasone (LOTRISONE) cream APPLY CREAM TO AFFECTED AREA TWICE DAILY    . fluticasone (CUTIVATE) 0.05 % cream APPLY AA BID    . ketoconazole (NIZORAL) 2 % cream APPLY AA BID.    Marland Kitchen MICARDIS HCT 40-12.5 MG tablet     . omeprazole (PRILOSEC) 40 MG capsule     . pantoprazole (PROTONIX) 40 MG tablet Take 1 tablet (40 mg total) by mouth daily before breakfast. 90 tablet 3  . potassium chloride (K-DUR) 10 MEQ tablet Take 10 mEq by mouth daily.    . pregabalin (LYRICA) 150 MG capsule     . tamsulosin (FLOMAX) 0.4 MG CAPS capsule Take by mouth.    . tolterodine (DETROL LA) 4 MG 24 hr capsule Take 4 mg by mouth daily.    Marland Kitchen ZETIA 10 MG tablet      No current facility-administered medications for this visit.    Patient Active Problem List   Diagnosis Date Noted  . Impingement syndrome of left shoulder 01/27/2020  . Olecranon bursitis, right elbow 09/24/2017  . Gout 09/19/2016  . Essential hypertension 09/19/2016  . Paresthesia 09/19/2016  . GERD (gastroesophageal reflux disease) 09/19/2016   Past Medical History:  Diagnosis Date  . Abnormal EKG   . Arthritis   . Benign hypertensive heart disease without HF (heart failure)   . Cataract    bil cataracts  . Colon polyps   . GERD (gastroesophageal reflux disease)   . Gout   . Hyperlipemia   .  Hypertension   . Kidney stones   . Neuromuscular disorder (HCC)    neuropathy  . Palpitations     Family History  Problem Relation Age of Onset  . Hypertension Mother   . Diabetes Mother   . Hyperlipidemia Mother   . Hypertension Father   . Hypertension Sister   . Hyperlipidemia Sister   . Heart disease Sister   . Stroke Brother   . Seizures Brother   . Colon cancer Neg Hx   . Stomach cancer Neg Hx   . Esophageal cancer Neg Hx   . Rectal cancer Neg Hx     Past Surgical History:  Procedure Laterality  Date  . ELBOW BURSA SURGERY Right 09/2017   Social History   Occupational History  . Occupation: Retired  Tobacco Use  . Smoking status: Never Smoker  . Smokeless tobacco: Never Used  Substance and Sexual Activity  . Alcohol use: Yes    Comment: Rarely  . Drug use: No  . Sexual activity: Not on file

## 2020-04-27 DIAGNOSIS — I10 Essential (primary) hypertension: Secondary | ICD-10-CM | POA: Diagnosis not present

## 2020-04-27 DIAGNOSIS — R7309 Other abnormal glucose: Secondary | ICD-10-CM | POA: Diagnosis not present

## 2020-04-27 DIAGNOSIS — E785 Hyperlipidemia, unspecified: Secondary | ICD-10-CM | POA: Diagnosis not present

## 2020-04-29 DIAGNOSIS — R7309 Other abnormal glucose: Secondary | ICD-10-CM | POA: Diagnosis not present

## 2020-04-29 DIAGNOSIS — E785 Hyperlipidemia, unspecified: Secondary | ICD-10-CM | POA: Diagnosis not present

## 2020-04-29 DIAGNOSIS — I10 Essential (primary) hypertension: Secondary | ICD-10-CM | POA: Diagnosis not present

## 2020-04-29 DIAGNOSIS — F064 Anxiety disorder due to known physiological condition: Secondary | ICD-10-CM | POA: Diagnosis not present

## 2020-07-02 DIAGNOSIS — R3912 Poor urinary stream: Secondary | ICD-10-CM | POA: Diagnosis not present

## 2020-07-02 DIAGNOSIS — N401 Enlarged prostate with lower urinary tract symptoms: Secondary | ICD-10-CM | POA: Diagnosis not present

## 2020-07-02 DIAGNOSIS — Q615 Medullary cystic kidney: Secondary | ICD-10-CM | POA: Diagnosis not present

## 2020-07-14 ENCOUNTER — Ambulatory Visit (INDEPENDENT_AMBULATORY_CARE_PROVIDER_SITE_OTHER): Payer: Medicare Other

## 2020-07-14 ENCOUNTER — Encounter: Payer: Self-pay | Admitting: Orthopaedic Surgery

## 2020-07-14 ENCOUNTER — Other Ambulatory Visit: Payer: Self-pay

## 2020-07-14 ENCOUNTER — Ambulatory Visit (INDEPENDENT_AMBULATORY_CARE_PROVIDER_SITE_OTHER): Payer: Medicare Other | Admitting: Orthopaedic Surgery

## 2020-07-14 VITALS — Ht 67.0 in | Wt 176.0 lb

## 2020-07-14 DIAGNOSIS — M7711 Lateral epicondylitis, right elbow: Secondary | ICD-10-CM

## 2020-07-14 DIAGNOSIS — M25521 Pain in right elbow: Secondary | ICD-10-CM

## 2020-07-14 DIAGNOSIS — M7710 Lateral epicondylitis, unspecified elbow: Secondary | ICD-10-CM | POA: Insufficient documentation

## 2020-07-14 NOTE — Progress Notes (Signed)
Office Visit Note   Patient: Scott Graves           Date of Birth: [birth year]           MRN: [id number] Visit Date: 07/14/2020              Requested by: Lucianne Lei, [address] [place],  [place] [address] PCP: Lucianne Lei, MD   Assessment & Plan: Visit Diagnoses:  1. Pain in right elbow   2. Lateral epicondylitis of right elbow     Plan: Lateral epicondylitis right elbow i.e. tennis elbow.  Long discussion regarding diagnosis and treatment options.  He would like to try Voltaren gel and a tennis elbow splint.  Reevaluate in the next 4 to 6 weeks if no improvement for consideration of cortisone injection.  Might have a mild arthritis of the elbow joint as he lacks just a few degrees to full extension compared to the opposite elbow  Follow-Up Instructions: Return if symptoms worsen or fail to improve.   Orders:  Orders Placed This Encounter  Procedures  . XR Elbow 2 Views Right   No orders of the defined types were placed in this encounter.     Procedures: No procedures performed   Clinical Data: No additional findings.   Subjective: Chief Complaint  Patient presents with  . Right Elbow - Pain  Patient presents today for right elbow pain. He said that it has been hurting for about two months. No known injury. His pain is located in his lateral elbow. His elbow feels stiff in the morning. No swelling. He has taken Tylenol if needed.He has noticed that it seems to improve if he has been using it a lot, but it returns again after resting. He is left hand dominant. He was here two years for olecranon bursitis in this same elbow.   HPI  Review of Systems   Objective: Vital Signs: Ht 5\' 7"  (1.702 m)   Wt 176 lb (79.8 kg)   BMI 27.57 kg/m   Physical Exam Constitutional:      Appearance: He is well-developed.  Eyes:     Pupils: Pupils are equal, round, and reactive to light.  Pulmonary:     Effort: Pulmonary effort is normal.  Skin:    General: Skin  is warm and dry.  Neurological:     Mental Status: He is alert and oriented to person, place, and time.  Psychiatric:        Behavior: Behavior normal.     Ortho Exam right elbow with local tenderness over the lateral epicondyle.  Has pain with grip more and extension than flexion.  Lacks just a few degrees to full extension compared to the left elbow.  Neurologically intact.  Normal pronation and supination.  Has a palpable osteophyte along the tip of the olecranon but without a bursa formation or pain.  Skin intact.  There is some crepitation with elbow motion possibly consistent with synovitis but without any palpable effusion  Specialty Comments:  No specialty comments available.  Imaging: XR Elbow 2 Views Right  Result Date: 07/14/2020 Films of the right elbow did not demonstrate any acute changes.  There is spurring along the olecranon.  Patient has a prior history of olecranon bursitis but presently is asymptomatic in that area.  No effusion.  Joint spaces are well-maintained.  Very minimal degenerative changes which might account for slight lack of full extension    PMFS History: Patient Active Problem List  Diagnosis Date Noted  . Tennis elbow 07/14/2020  . Impingement syndrome of left shoulder 01/27/2020  . Olecranon bursitis, right elbow 09/24/2017  . Gout 09/19/2016  . Essential hypertension 09/19/2016  . Paresthesia 09/19/2016  . GERD (gastroesophageal reflux disease) 09/19/2016   Past Medical History:  Diagnosis Date  . Abnormal EKG   . Arthritis   . Benign hypertensive heart disease without HF (heart failure)   . Cataract    bil cataracts  . Colon polyps   . GERD (gastroesophageal reflux disease)   . Gout   . Hyperlipemia   . Hypertension   . Kidney stones   . Neuromuscular disorder (HCC)    neuropathy  . Palpitations     Family History  Problem Relation Age of Onset  . Hypertension Mother   . Diabetes Mother   . Hyperlipidemia Mother   .  Hypertension Father   . Hypertension Sister   . Hyperlipidemia Sister   . Heart disease Sister   . Stroke Brother   . Seizures Brother   . Colon cancer Neg Hx   . Stomach cancer Neg Hx   . Esophageal cancer Neg Hx   . Rectal cancer Neg Hx     Past Surgical History:  Procedure Laterality Date  . ELBOW BURSA SURGERY Right 09/2017   Social History   Occupational History  . Occupation: Retired  Tobacco Use  . Smoking status: Never Smoker  . Smokeless tobacco: Never Used  Vaping Use  . Vaping Use: Never used  Substance and Sexual Activity  . Alcohol use: Yes    Comment: Rarely  . Drug use: No  . Sexual activity: Not on file

## 2020-09-02 DIAGNOSIS — F064 Anxiety disorder due to known physiological condition: Secondary | ICD-10-CM | POA: Diagnosis not present

## 2020-09-02 DIAGNOSIS — F99 Mental disorder, not otherwise specified: Secondary | ICD-10-CM | POA: Diagnosis not present

## 2020-09-02 DIAGNOSIS — G620 Drug-induced polyneuropathy: Secondary | ICD-10-CM | POA: Diagnosis not present

## 2020-09-02 DIAGNOSIS — R000 Tachycardia, unspecified: Secondary | ICD-10-CM | POA: Diagnosis not present

## 2020-10-08 DIAGNOSIS — Z23 Encounter for immunization: Secondary | ICD-10-CM | POA: Diagnosis not present

## 2020-10-26 DIAGNOSIS — N182 Chronic kidney disease, stage 2 (mild): Secondary | ICD-10-CM | POA: Diagnosis not present

## 2020-10-26 DIAGNOSIS — I129 Hypertensive chronic kidney disease with stage 1 through stage 4 chronic kidney disease, or unspecified chronic kidney disease: Secondary | ICD-10-CM | POA: Diagnosis not present

## 2020-10-26 DIAGNOSIS — D631 Anemia in chronic kidney disease: Secondary | ICD-10-CM | POA: Diagnosis not present

## 2020-10-26 DIAGNOSIS — M109 Gout, unspecified: Secondary | ICD-10-CM | POA: Diagnosis not present

## 2020-10-26 DIAGNOSIS — Q615 Medullary cystic kidney: Secondary | ICD-10-CM | POA: Diagnosis not present

## 2020-11-11 ENCOUNTER — Other Ambulatory Visit: Payer: Self-pay

## 2020-11-11 ENCOUNTER — Ambulatory Visit
Admission: EM | Admit: 2020-11-11 | Discharge: 2020-11-11 | Disposition: A | Discharge status: Home / Self Care | Payer: Medicare Other | Attending: Emergency Medicine | Admitting: Emergency Medicine

## 2020-11-11 DIAGNOSIS — J029 Acute pharyngitis, unspecified: Secondary | ICD-10-CM | POA: Diagnosis not present

## 2020-11-11 DIAGNOSIS — Z20828 Contact with and (suspected) exposure to other viral communicable diseases: Secondary | ICD-10-CM | POA: Diagnosis not present

## 2020-11-11 LAB — POCT RAPID STREP A (OFFICE): Rapid Strep A Screen: NEGATIVE

## 2020-11-11 MED ORDER — LORATADINE 10 MG PO TABS: 10.0000 mg | ORAL_TABLET | Freq: Every day | ORAL | 0 refills | Status: DC

## 2020-11-11 NOTE — Discharge Instructions (Signed)
Sore Throat  Your rapid strep tested Negative today.  Covid test pending. Symptoms are likely viral and should resolve over the next week or related to postnasal drainage  Please continue Tylenol or Ibuprofen for fever and pain. May try salt water gargles, cepacol lozenges, throat spray, or OTC cold relief medicine for throat discomfort. If you also have congestion take a daily anti-histamine like Zyrtec, Claritin, and a oral decongestant to help with post nasal drip that may be irritating your throat.   Stay hydrated and drink plenty of fluids to keep your throat coated relieve irritation.

## 2020-11-11 NOTE — ED Triage Notes (Signed)
Pt presents with sore throat and congestion X 3 days.

## 2020-11-11 NOTE — ED Provider Notes (Addendum)
[facility]    CSN: [id number] Arrival date & time: 11/11/20  0801      History   Chief Complaint Chief Complaint  Patient presents with  . Sore Throat    HPI Scott Graves is a 69 y.o. male history of hypertension, hyperlipidemia presenting today for sore throat.  Main complaint is sore throat, reports a lot of irritation with swallowing, initially just in the evenings, but now is feeling persistent during the day and having increased pain with swallowing/eating.  Reports mild congestion and cough as well.  Slight tickle in throat.  Using lozenges without relief.  Denies known exposures.  HPI  Past Medical History:  Diagnosis Date  . Abnormal EKG   . Arthritis   . Benign hypertensive heart disease without HF (heart failure)   . Cataract    bil cataracts  . Colon polyps   . GERD (gastroesophageal reflux disease)   . Gout   . Hyperlipemia   . Hypertension   . Kidney stones   . Neuromuscular disorder (HCC)    neuropathy  . Palpitations     Patient Active Problem List   Diagnosis Date Noted  . Tennis elbow 07/14/2020  . Impingement syndrome of left shoulder 01/27/2020  . Olecranon bursitis, right elbow 09/24/2017  . Gout 09/19/2016  . Essential hypertension 09/19/2016  . Paresthesia 09/19/2016  . GERD (gastroesophageal reflux disease) 09/19/2016    Past Surgical History:  Procedure Laterality Date  . ELBOW BURSA SURGERY Right 09/2017       Home Medications    Prior to Admission medications   Medication Sig Start Date End Date Taking? Authorizing Provider  loratadine (CLARITIN) 10 MG tablet Take 1 tablet (10 mg total) by mouth daily. 11/11/20  Yes Seanpatrick Maisano C, PA-C  allopurinol (ZYLOPRIM) 300 MG tablet Take 300 mg by mouth daily.    [provider]  Cholecalciferol (VITAMIN D3) 2000 units TABS Take 2,000 Units by mouth daily.    [provider]  clotrimazole-betamethasone (LOTRISONE) cream APPLY CREAM TO AFFECTED AREA  TWICE DAILY 10/14/18   [provider]  fluticasone (CUTIVATE) 0.05 % cream APPLY AA BID 01/03/19   [provider]  ketoconazole (NIZORAL) 2 % cream APPLY AA BID. 01/03/19   [provider]  MICARDIS HCT 40-12.5 MG tablet  05/05/16   [provider]  omeprazole (PRILOSEC) 40 MG capsule  05/26/16   [provider]  pantoprazole (PROTONIX) 40 MG tablet Take 1 tablet (40 mg total) by mouth daily before breakfast. 12/15/19   Unk Lightning, PA  potassium chloride (K-DUR) 10 MEQ tablet Take 10 mEq by mouth daily.    [provider]  pregabalin (LYRICA) 150 MG capsule  01/31/19   [provider]  tamsulosin (FLOMAX) 0.4 MG CAPS capsule Take by mouth.    [provider]  tolterodine (DETROL LA) 4 MG 24 hr capsule Take 4 mg by mouth daily.    [provider]  ZETIA 10 MG tablet  05/05/16   [provider]    Family History Family History  Problem Relation Age of Onset  . Hypertension Mother   . Diabetes Mother   . Hyperlipidemia Mother   . Hypertension Father   . Hypertension Sister   . Hyperlipidemia Sister   . Heart disease Sister   . Stroke Brother   . Seizures Brother   . Colon cancer Neg Hx   . Stomach cancer Neg Hx   . Esophageal cancer Neg Hx   .  Rectal cancer Neg Hx     Social History Social History   Tobacco Use  . Smoking status: Never Smoker  . Smokeless tobacco: Never Used  Vaping Use  . Vaping Use: Never used  Substance Use Topics  . Alcohol use: Yes    Comment: Rarely  . Drug use: No     Allergies   Statins   Review of Systems Review of Systems  Constitutional: Negative for activity change, appetite change, chills, fatigue and fever.  HENT: Positive for congestion, rhinorrhea and sore throat. Negative for ear pain, sinus pressure and trouble swallowing.   Eyes: Negative for discharge and redness.  Respiratory: Positive for cough. Negative for chest tightness and  shortness of breath.   Cardiovascular: Negative for chest pain.  Gastrointestinal: Negative for abdominal pain, diarrhea, nausea and vomiting.  Musculoskeletal: Negative for myalgias.  Skin: Negative for rash.  Neurological: Negative for dizziness, light-headedness and headaches.     Physical Exam Triage Vital Signs ED Triage Vitals  Enc Vitals Group     BP      Pulse      Resp      Temp      Temp src      SpO2      Weight      Height      Head Circumference      Peak Flow      Pain Score      Pain Loc      Pain Edu?      Excl. in [place]?    No data found.  Updated Vital Signs BP (!) 142/81 (BP Location: Left Arm)   Pulse 97   Temp 98.7 F (37.1 C) (Oral)   Resp 18   SpO2 98%   Visual Acuity Right Eye Distance:   Left Eye Distance:   Bilateral Distance:    Right Eye Near:   Left Eye Near:    Bilateral Near:     Physical Exam Vitals and nursing note reviewed.  Constitutional:      Appearance: He is well-developed and well-nourished.     Comments: No acute distress  HENT:     Head: Normocephalic and atraumatic.     Ears:     Comments: Bilateral ears without tenderness to palpation of external auricle, tragus and mastoid, EAC's without erythema or swelling, TM's with good bony landmarks and cone of light. Non erythematous.     Nose: Nose normal.     Mouth/Throat:     Comments: Oral mucosa pink and moist, no tonsillar enlargement or exudate. Posterior pharynx patent and nonerythematous, no uvula deviation or swelling. Normal phonation. Eyes:     Conjunctiva/sclera: Conjunctivae normal.  Cardiovascular:     Rate and Rhythm: Normal rate and regular rhythm.  Pulmonary:     Effort: Pulmonary effort is normal. No respiratory distress.     Comments: Breathing comfortably at rest, CTABL, no wheezing, rales or other adventitious sounds auscultated Abdominal:     General: There is no distension.  Musculoskeletal:        General: Normal range of motion.      Cervical back: Neck supple.  Skin:    General: Skin is warm and dry.  Neurological:     Mental Status: He is alert and oriented to person, place, and time.  Psychiatric:        Mood and Affect: Mood and affect normal.      UC Treatments / Results  Labs (all labs ordered  are listed, but only abnormal results are displayed) Labs Reviewed  CULTURE, GROUP A STREP ([facility])  NOVEL CORONAVIRUS, NAA  POCT RAPID STREP A (OFFICE)    EKG   Radiology No results found.  Procedures Procedures (including critical care time)  Medications Ordered in UC Medications - No data to display  Initial Impression / Assessment and Plan / UC Course  I have reviewed the triage vital signs and the nursing notes.  Pertinent labs & imaging results that were available during my care of the patient were reviewed by me and considered in my medical decision making (see chart for details).     Strep test negative, Covid test pending.  Recommending symptomatic and supportive care as symptoms likely viral versus postnasal drainage.  Discussed strict return precautions. Patient verbalized understanding and is agreeable with plan.  Final Clinical Impressions(s) / UC Diagnoses   Final diagnoses:  Sore throat     Discharge Instructions     Sore Throat  Your rapid strep tested Negative today.  Covid test pending. Symptoms are likely viral and should resolve over the next week or related to postnasal drainage  Please continue Tylenol or Ibuprofen for fever and pain. May try salt water gargles, cepacol lozenges, throat spray, or OTC cold relief medicine for throat discomfort. If you also have congestion take a daily anti-histamine like Zyrtec, Claritin, and a oral decongestant to help with post nasal drip that may be irritating your throat.   Stay hydrated and drink plenty of fluids to keep your throat coated relieve irritation.     ED Prescriptions    Medication Sig Dispense Auth. Provider    loratadine (CLARITIN) 10 MG tablet Take 1 tablet (10 mg total) by mouth daily. 30 tablet Aleana Fifita, Jackson C, PA-C     PDMP not reviewed this encounter.   Lew Dawes, PA-C 11/11/20 0832    Lew Dawes, PA-C 11/11/20 [phone]

## 2020-11-12 ENCOUNTER — Ambulatory Visit (HOSPITAL_COMMUNITY): Payer: Self-pay

## 2020-11-13 LAB — CULTURE, GROUP A STREP (THRC)

## 2020-11-13 LAB — SARS-COV-2, NAA 2 DAY TAT

## 2020-11-13 LAB — NOVEL CORONAVIRUS, NAA: SARS-CoV-2, NAA: DETECTED — AB

## 2020-11-14 ENCOUNTER — Telehealth (HOSPITAL_COMMUNITY): Payer: Self-pay

## 2020-11-14 NOTE — Telephone Encounter (Signed)
Called to discuss with patient about Covid symptoms and the use of a treatment for those with mild to moderate Covid symptoms and at a high risk of hospitalization.     Pt does not appear to qualify for this infusion due to the FDA Emergency Use Authorization and NIH guidelines.    Symptom onset: 12/24 Vaccinated: yes, and received booster Qualified for Infusion: No, per NIH guidelines

## 2020-11-26 DIAGNOSIS — Z1152 Encounter for screening for COVID-19: Secondary | ICD-10-CM | POA: Diagnosis not present

## 2020-12-30 DIAGNOSIS — E785 Hyperlipidemia, unspecified: Secondary | ICD-10-CM | POA: Diagnosis not present

## 2020-12-30 DIAGNOSIS — R7309 Other abnormal glucose: Secondary | ICD-10-CM | POA: Diagnosis not present

## 2020-12-30 DIAGNOSIS — I10 Essential (primary) hypertension: Secondary | ICD-10-CM | POA: Diagnosis not present

## 2021-01-04 DIAGNOSIS — H40053 Ocular hypertension, bilateral: Secondary | ICD-10-CM | POA: Diagnosis not present

## 2021-01-04 DIAGNOSIS — U099 Post covid-19 condition, unspecified: Secondary | ICD-10-CM | POA: Diagnosis not present

## 2021-01-04 DIAGNOSIS — R7309 Other abnormal glucose: Secondary | ICD-10-CM | POA: Diagnosis not present

## 2021-01-04 DIAGNOSIS — I10 Essential (primary) hypertension: Secondary | ICD-10-CM | POA: Diagnosis not present

## 2021-01-04 DIAGNOSIS — N189 Chronic kidney disease, unspecified: Secondary | ICD-10-CM | POA: Diagnosis not present

## 2021-01-04 DIAGNOSIS — E1169 Type 2 diabetes mellitus with other specified complication: Secondary | ICD-10-CM | POA: Diagnosis not present

## 2021-01-04 DIAGNOSIS — H2513 Age-related nuclear cataract, bilateral: Secondary | ICD-10-CM | POA: Diagnosis not present

## 2021-01-04 DIAGNOSIS — R000 Tachycardia, unspecified: Secondary | ICD-10-CM | POA: Diagnosis not present

## 2021-01-04 DIAGNOSIS — E785 Hyperlipidemia, unspecified: Secondary | ICD-10-CM | POA: Diagnosis not present

## 2021-01-04 DIAGNOSIS — H1013 Acute atopic conjunctivitis, bilateral: Secondary | ICD-10-CM | POA: Diagnosis not present

## 2021-02-18 ENCOUNTER — Other Ambulatory Visit: Payer: Self-pay | Admitting: Physician Assistant

## 2021-03-02 DIAGNOSIS — E785 Hyperlipidemia, unspecified: Secondary | ICD-10-CM | POA: Diagnosis not present

## 2021-03-02 DIAGNOSIS — F064 Anxiety disorder due to known physiological condition: Secondary | ICD-10-CM | POA: Diagnosis not present

## 2021-03-02 DIAGNOSIS — R7309 Other abnormal glucose: Secondary | ICD-10-CM | POA: Diagnosis not present

## 2021-03-02 DIAGNOSIS — I10 Essential (primary) hypertension: Secondary | ICD-10-CM | POA: Diagnosis not present

## 2021-03-04 DIAGNOSIS — I10 Essential (primary) hypertension: Secondary | ICD-10-CM | POA: Diagnosis not present

## 2021-03-04 DIAGNOSIS — E1169 Type 2 diabetes mellitus with other specified complication: Secondary | ICD-10-CM | POA: Diagnosis not present

## 2021-03-04 DIAGNOSIS — G622 Polyneuropathy due to other toxic agents: Secondary | ICD-10-CM | POA: Diagnosis not present

## 2021-03-04 DIAGNOSIS — R21 Rash and other nonspecific skin eruption: Secondary | ICD-10-CM | POA: Diagnosis not present

## 2021-03-04 DIAGNOSIS — K219 Gastro-esophageal reflux disease without esophagitis: Secondary | ICD-10-CM | POA: Diagnosis not present

## 2021-03-07 ENCOUNTER — Telehealth: Payer: Self-pay | Admitting: Gastroenterology

## 2021-03-07 MED ORDER — PANTOPRAZOLE SODIUM 40 MG PO TBEC: 40.0000 mg | DELAYED_RELEASE_TABLET | Freq: Every day | ORAL | 0 refills | Status: DC

## 2021-03-07 NOTE — Telephone Encounter (Signed)
Inbound call from patient with medication refill for Pantoprazole. He have scheduled an appointment 6/3.

## 2021-03-07 NOTE — Telephone Encounter (Signed)
Refills of protonix sent to [place] Must keep follow up appointment

## 2021-03-08 ENCOUNTER — Other Ambulatory Visit: Payer: Self-pay

## 2021-03-08 MED ORDER — PANTOPRAZOLE SODIUM 40 MG PO TBEC: 40.0000 mg | DELAYED_RELEASE_TABLET | Freq: Every day | ORAL | 0 refills | Status: DC

## 2021-04-15 ENCOUNTER — Encounter: Payer: Self-pay | Admitting: Physician Assistant

## 2021-04-15 ENCOUNTER — Ambulatory Visit (INDEPENDENT_AMBULATORY_CARE_PROVIDER_SITE_OTHER): Payer: Medicare Other | Admitting: Physician Assistant

## 2021-04-15 VITALS — BP 132/74 | HR 88 | Ht 67.0 in | Wt 166.0 lb

## 2021-04-15 DIAGNOSIS — D509 Iron deficiency anemia, unspecified: Secondary | ICD-10-CM | POA: Diagnosis not present

## 2021-04-15 DIAGNOSIS — K219 Gastro-esophageal reflux disease without esophagitis: Secondary | ICD-10-CM

## 2021-04-15 MED ORDER — PANTOPRAZOLE SODIUM 40 MG PO TBEC: 40.0000 mg | DELAYED_RELEASE_TABLET | Freq: Every day | ORAL | 3 refills | Status: DC

## 2021-04-15 NOTE — Progress Notes (Signed)
Chief Complaint: Follow-up GERD  HPI:    Scott Graves is a 70 year old African-American male with past medical history as listed below including reflux, known to Dr. Silverio Decamp, who presents clinic today for follow-up of his reflux and medication refills of Protonix.    10/06/2019 patient seen in clinic for iron deficiency anemia.  At that time scheduled for EGD and colonoscopy.  (Results as below)    Today, the patient tells me that he is doing well as long as he takes his Protonix 40 mg once daily in the morning.  Occasionally he will have problems if he eats too much sugary stuff or drinks too much soda, but otherwise his symptoms are well controlled.  Does tell me his PCP is still following his anemia but most recently his hemoglobin had gone back up so his referral to hematology had been canceled.  He has an office visit coming up soon for another recheck.    Denies fever, chills, weight loss or change in bowel habits.  Previous GI history: EGD 10/22/2019: Z-line regular, gastritis and erythematous duodenopathy ; pathology showed peptic duodenitis  Colonoscopy 10/22/2019: With 2 polyps in the sigmoid colon and cecum and 5 polyps in the transverse and ascending colon; pathology showed tubular adenoma and hyperplastic polyp repeat recommended in 3 years  Barium esophagogram was normal 2012   Colonoscopy 01/2003: Small transverse colon polyp adenomatous polyp   Colonoscopy 2009 : mild internal hemorrhoids   Colonoscopy 2012: Four sessile polyps removed from descending, transverse and ascending colon    Colonoscopy 12/29/2014: Dimunitive  Polyp removed from ascending colon, tubular adenoma, repeat recommended in 2021   EGD 2012: Small hiatal hernia with schatzki's ring, no dilation   Swallow study 2016 : mild pharyngeal dysphagia  Past Medical History:  Diagnosis Date  . Abnormal EKG   . Arthritis   . Benign hypertensive heart disease without HF (heart failure)   . Cataract    bil  cataracts  . Colon polyps   . GERD (gastroesophageal reflux disease)   . Gout   . Hyperlipemia   . Hypertension   . Kidney stones   . Neuromuscular disorder (HCC)    neuropathy  . Palpitations     Past Surgical History:  Procedure Laterality Date  . ELBOW BURSA SURGERY Right 09/2017    Current Outpatient Medications  Medication Sig Dispense Refill  . MICARDIS HCT 40-12.5 MG tablet     . pantoprazole (PROTONIX) 40 MG tablet Take 1 tablet (40 mg total) by mouth daily before breakfast. Must keep office appointment scheduled for 6/3 to continue refills 90 tablet 0  . ZETIA 10 MG tablet      No current facility-administered medications for this visit.    Allergies as of 04/15/2021 - Review Complete 04/15/2021  Allergen Reaction Noted  . Statins  06/08/2016    Family History  Problem Relation Age of Onset  . Hypertension Mother   . Diabetes Mother   . Hyperlipidemia Mother   . Hypertension Father   . Hypertension Sister   . Hyperlipidemia Sister   . Heart disease Sister   . Stroke Brother   . Seizures Brother   . Colon cancer Neg Hx   . Stomach cancer Neg Hx   . Esophageal cancer Neg Hx   . Rectal cancer Neg Hx     Social History   Socioeconomic History  . Marital status: Married    Spouse name: Not on file  . Number of children: 1  .  Years of education: HS  . Highest education level: Not on file  Occupational History  . Occupation: Retired  Tobacco Use  . Smoking status: Never Smoker  . Smokeless tobacco: Never Used  Vaping Use  . Vaping Use: Never used  Substance and Sexual Activity  . Alcohol use: Yes    Comment: Rarely  . Drug use: No  . Sexual activity: Not on file  Other Topics Concern  . Not on file  Social History Narrative   Rare caffeine use   Denies abuse and feels safe at home.    Social Determinants of Health   Financial Resource Strain: Not on file  Food Insecurity: Not on file  Transportation Needs: Not on file  Physical  Activity: Not on file  Stress: Not on file  Social Connections: Not on file  Intimate Partner Violence: Not on file    Review of Systems:    Constitutional: No weight loss, fever or chills Cardiovascular: No chest pain  Respiratory: No SOB  Gastrointestinal: See HPI and otherwise negative   Physical Exam:  Vital signs: BP 132/74   Pulse 88   Ht 5\' 7"  (1.702 m)   Wt 166 lb (75.3 kg)   SpO2 99%   BMI 26.00 kg/m   Constitutional:   Pleasant AA male appears to be in NAD, Well developed, Well nourished, alert and cooperative Respiratory: Respirations even and unlabored. Lungs clear to auscultation bilaterally.   No wheezes, crackles, or rhonchi.  Cardiovascular: Normal S1, S2. No MRG. Regular rate and rhythm. No peripheral edema, cyanosis or pallor.  Gastrointestinal:  Soft, nondistended, nontender. No rebound or guarding. Normal bowel sounds. No appreciable masses or hepatomegaly. Rectal:  Not performed.  Psychiatric: Oriented to person, place and time. Demonstrates good judgement and reason without abnormal affect or behaviors.  We do not have patient's recent labs.  Assessment: 1.  GERD: Controlled on Pantoprazole 40 mg daily 2.  IDA: EGD and colonoscopy essentially unrevealing in 2020, PCP is following hemoglobin  Plan: 1.  Refilled Pantoprazole 40 mg daily, 30-60 minutes before breakfast #90 with 3 refills sent to [facility]. 2.  Encouraged patient to continue to follow with his PCP in regards to low hemoglobin, next steps would be hematology referral below.  He is aware. 3.  Patient to follow in clinic with [place] as needed in the future or in a year for more refills.  Also discussed that he can have these refilled by his PCP if that is all he is being seen for, though next year he will also be due for repeat colonoscopy.  Ellouise Newer, PA-C [facility] 04/15/2021, 2:45 PM  Cc: Lucianne Lei, MD

## 2021-04-15 NOTE — Patient Instructions (Signed)
If you are age 71 or older, your body mass index should be between 23-30. Your Body mass index is 26 kg/m. If this is out of the aforementioned range listed, please consider follow up with your Primary Care Provider.  If you are age 35 or younger, your body mass index should be between 19-25. Your Body mass index is 26 kg/m. If this is out of the aformentioned range listed, please consider follow up with your Primary Care Provider.   We have sent the following medications to your pharmacy for you to pick up at your convenience: Protonix 40 mg .   The [facility] providers would like to encourage you to use [facility] to communicate with providers for non-urgent requests or questions.  Due to long hold times on the telephone, sending your provider a message by [facility] may be a faster and more efficient way to get a response.  Please allow 48 business hours for a response.  Please remember that this is for non-urgent requests.   Thank you for choosing me and [facility].  Ellouise Newer, PA-C

## 2021-04-21 NOTE — Progress Notes (Signed)
Reviewed and agree with documentation and assessment and plan. K. Veena Sullivan Jacuinde , MD   

## 2021-05-04 DIAGNOSIS — R6884 Jaw pain: Secondary | ICD-10-CM | POA: Diagnosis not present

## 2021-05-26 DIAGNOSIS — Z23 Encounter for immunization: Secondary | ICD-10-CM | POA: Diagnosis not present

## 2021-06-14 DIAGNOSIS — L820 Inflamed seborrheic keratosis: Secondary | ICD-10-CM | POA: Diagnosis not present

## 2021-06-29 DIAGNOSIS — E1169 Type 2 diabetes mellitus with other specified complication: Secondary | ICD-10-CM | POA: Diagnosis not present

## 2021-06-29 DIAGNOSIS — I10 Essential (primary) hypertension: Secondary | ICD-10-CM | POA: Diagnosis not present

## 2021-06-29 DIAGNOSIS — R7309 Other abnormal glucose: Secondary | ICD-10-CM | POA: Diagnosis not present

## 2021-06-29 DIAGNOSIS — E785 Hyperlipidemia, unspecified: Secondary | ICD-10-CM | POA: Diagnosis not present

## 2021-07-04 DIAGNOSIS — E7800 Pure hypercholesterolemia, unspecified: Secondary | ICD-10-CM | POA: Diagnosis not present

## 2021-07-04 DIAGNOSIS — I10 Essential (primary) hypertension: Secondary | ICD-10-CM | POA: Diagnosis not present

## 2021-07-04 DIAGNOSIS — E1169 Type 2 diabetes mellitus with other specified complication: Secondary | ICD-10-CM | POA: Diagnosis not present

## 2021-07-05 DIAGNOSIS — N401 Enlarged prostate with lower urinary tract symptoms: Secondary | ICD-10-CM | POA: Diagnosis not present

## 2021-07-11 DIAGNOSIS — R3911 Hesitancy of micturition: Secondary | ICD-10-CM | POA: Diagnosis not present

## 2021-07-11 DIAGNOSIS — N401 Enlarged prostate with lower urinary tract symptoms: Secondary | ICD-10-CM | POA: Diagnosis not present

## 2021-07-28 ENCOUNTER — Encounter (HOSPITAL_COMMUNITY): Payer: Self-pay | Admitting: Emergency Medicine

## 2021-07-28 ENCOUNTER — Emergency Department (HOSPITAL_COMMUNITY): Payer: Medicare Other

## 2021-07-28 ENCOUNTER — Emergency Department (HOSPITAL_COMMUNITY)
Admission: EM | Admit: 2021-07-28 | Discharge: 2021-07-28 | Disposition: A | Discharge status: Home / Self Care | Payer: Medicare Other | Attending: Emergency Medicine | Admitting: Emergency Medicine

## 2021-07-28 ENCOUNTER — Other Ambulatory Visit: Payer: Self-pay

## 2021-07-28 DIAGNOSIS — I10 Essential (primary) hypertension: Secondary | ICD-10-CM | POA: Diagnosis not present

## 2021-07-28 DIAGNOSIS — I700 Atherosclerosis of aorta: Secondary | ICD-10-CM | POA: Diagnosis not present

## 2021-07-28 DIAGNOSIS — R0602 Shortness of breath: Secondary | ICD-10-CM | POA: Diagnosis not present

## 2021-07-28 LAB — CBC WITH DIFFERENTIAL/PLATELET
Abs Immature Granulocytes: 0.01 10*3/uL (ref 0.00–0.07)
Basophils Absolute: 0.1 10*3/uL (ref 0.0–0.1)
Basophils Relative: 1 %
Eosinophils Absolute: 0.2 10*3/uL (ref 0.0–0.5)
Eosinophils Relative: 3 %
HCT: 36.5 % — ABNORMAL LOW (ref 39.0–52.0)
Hemoglobin: 11.8 g/dL — ABNORMAL LOW (ref 13.0–17.0)
Immature Granulocytes: 0 %
Lymphocytes Relative: 50 %
Lymphs Abs: 3 10*3/uL (ref 0.7–4.0)
MCH: 26.8 pg (ref 26.0–34.0)
MCHC: 32.3 g/dL (ref 30.0–36.0)
MCV: 83 fL (ref 80.0–100.0)
Monocytes Absolute: 0.6 10*3/uL (ref 0.1–1.0)
Monocytes Relative: 11 %
Neutro Abs: 2 10*3/uL (ref 1.7–7.7)
Neutrophils Relative %: 35 %
Platelets: 199 10*3/uL (ref 150–400)
RBC: 4.4 MIL/uL (ref 4.22–5.81)
RDW: 12.8 % (ref 11.5–15.5)
WBC: 5.9 10*3/uL (ref 4.0–10.5)
nRBC: 0 % (ref 0.0–0.2)

## 2021-07-28 LAB — BASIC METABOLIC PANEL
Anion gap: 10 (ref 5–15)
BUN: 17 mg/dL (ref 8–23)
CO2: 26 mmol/L (ref 22–32)
Calcium: 10 mg/dL (ref 8.9–10.3)
Chloride: 103 mmol/L (ref 98–111)
Creatinine, Ser: 1.29 mg/dL — ABNORMAL HIGH (ref 0.61–1.24)
GFR, Estimated: 60 mL/min — ABNORMAL LOW (ref >60)
Glucose, Bld: 142 mg/dL — ABNORMAL HIGH (ref 70–99)
Potassium: 3.1 mmol/L — ABNORMAL LOW (ref 3.5–5.1)
Sodium: 139 mmol/L (ref 135–145)

## 2021-07-28 LAB — TROPONIN I (HIGH SENSITIVITY): Troponin I (High Sensitivity): 5 ng/L (ref <18)

## 2021-07-28 MED ORDER — SODIUM CHLORIDE 0.9 % IV BOLUS: 1000.0000 mL | Freq: Once | INTRAVENOUS | Status: DC

## 2021-07-28 MED ORDER — POTASSIUM CHLORIDE CRYS ER 20 MEQ PO TBCR
40.0000 meq | EXTENDED_RELEASE_TABLET | Freq: Once | ORAL | Status: AC
  Administered 2021-07-28: 40 meq via ORAL
  Filled 2021-07-28: qty 2

## 2021-07-28 NOTE — ED Notes (Signed)
Pt refused IV. PA Aberman notified.

## 2021-07-28 NOTE — Discharge Instructions (Signed)
It was a pleasure taking care of you today.  As discussed, all of your labs were reassuring.  Cardiac markers were normal.  Your potassium was slightly low.  You were given potassium here in the ER.  Please follow-up with PCP for further evaluation of your blood pressure.  Return to the ER for new or worsening symptoms.

## 2021-07-28 NOTE — ED Triage Notes (Signed)
Pt reports he was woken from sleep w/ high blood pressure as evident by feeling light headedness.  He tried to calm himself down but continued to feel light headedness.  Pt anxious in triage.

## 2021-07-28 NOTE — ED Notes (Signed)
Patient verbalizes understanding of discharge instructions. Opportunity for questioning and answers were provided. Armband removed by staff, pt discharged from ED pt ambulated to lobby to go home with SO.

## 2021-07-28 NOTE — ED Provider Notes (Signed)
Emergency Medicine Provider Triage Evaluation Note  Scott Graves , a 70 y.o. male  was evaluated in triage.  Pt complains of lightheadedness.  States he woke up a bit ago and felt a little lightheaded with standing, felt like he was going to pass out but never lost consciousness.  States BP was high at home.  Took a cold shower and drank water which helped a little.  States now he just feels "unsettled" and a little anxious.  Denies focal numbness/weakness.  No chest pain.  Has had abnormal EKG's in the past but negative work-up with cardiology per patient.  Review of Systems  Positive: Lightheadedness,  Negative: Chest pain  Physical Exam  BP (!) 162/78   Pulse 94   Temp 97.6 F (36.4 C) (Oral)   Resp 16   SpO2 100%  Gen:   Awake, no distress   Resp:  Normal effort  MSK:   Moves extremities without difficulty, ambulatory with steady gait, normal speech Other:    Medical Decision Making  Medically screening exam initiated at 4:01 AM.  Appropriate orders placed.  Tremane Tirone was informed that the remainder of the evaluation will be completed by another provider, this initial triage assessment does not replace that evaluation, and the importance of remaining in the ED until their evaluation is complete.  Lightheaded with standing at home with elevated BP's.  No LOC at home, no head trauma.  No focal neurologic deficits in triage.  EKG, labs, CXR.  Orthostatic VS ordered.   Larene Pickett, PA-C 07/28/21 0405    Ripley Fraise, MD 07/28/21 [phone]

## 2021-07-28 NOTE — ED Provider Notes (Signed)
[facility] Provider Note   CSN: [id number] Arrival date & time: 07/28/21  0342     History Chief Complaint  Patient presents with   Hypertension    Lightheadedness     Scott Graves is a 70 y.o. male with a past medical history significant for GERD, hyperlipidemia, hypertension who presents to the ED due to elevated blood pressure.  Patient states he woke up in the middle the night and felt unwell which he describes as feeling "intoxicated".  No recent alcohol use.  Patient states he felt like he was somewhat hungover and generally did not feel well.  Denies chest pain and shortness of breath.  Denies abdominal pain, nausea, vomiting, diarrhea.  Patient states during this episode he checked his blood pressure which was elevated in the 150s.  Patient has a history of hypertension and has been compliant with his medications.  Denies headaches and visual changes.  No speech changes.  Denies unilateral weakness. No other symptoms. No treatment prior to arrival. No aggravating or alleviating factors. He notes he may have had a panic attack. He states he feels mostly better now.   History obtained from patient and past medical records. No interpreter used during encounter.       Past Medical History:  Diagnosis Date   Abnormal EKG    Arthritis    Benign hypertensive heart disease without HF (heart failure)    Cataract    bil cataracts   Colon polyps    GERD (gastroesophageal reflux disease)    Gout    Hyperlipemia    Hypertension    Kidney stones    Neuromuscular disorder (HCC)    neuropathy   Palpitations     Patient Active Problem List   Diagnosis Date Noted   Tennis elbow 07/14/2020   Impingement syndrome of left shoulder 01/27/2020   Olecranon bursitis, right elbow 09/24/2017   Gout 09/19/2016   Essential hypertension 09/19/2016   Paresthesia 09/19/2016   GERD (gastroesophageal reflux disease) 09/19/2016    Past Surgical  History:  Procedure Laterality Date   ELBOW BURSA SURGERY Right 09/2017       Family History  Problem Relation Age of Onset   Hypertension Mother    Diabetes Mother    Hyperlipidemia Mother    Hypertension Father    Hypertension Sister    Hyperlipidemia Sister    Heart disease Sister    Stroke Brother    Seizures Brother    Colon cancer Neg Hx    Stomach cancer Neg Hx    Esophageal cancer Neg Hx    Rectal cancer Neg Hx     Social History   Tobacco Use   Smoking status: Never   Smokeless tobacco: Never  Vaping Use   Vaping Use: Never used  Substance Use Topics   Alcohol use: Yes    Comment: Rarely   Drug use: No    Home Medications Prior to Admission medications   Medication Sig Start Date End Date Taking? Authorizing Provider  MICARDIS HCT 40-12.5 MG tablet  05/05/16   [provider]  pantoprazole (PROTONIX) 40 MG tablet Take 1 tablet (40 mg total) by mouth daily before breakfast. Must keep office appointment scheduled for 6/3 to continue refills 04/15/21   Levin Erp, PA  ZETIA 10 MG tablet  05/05/16   [provider]    Allergies    Statins  Review of Systems   Review of Systems  Constitutional:  Negative  for chills and fever.  Eyes:  Negative for visual disturbance.  Respiratory:  Negative for shortness of breath.   Cardiovascular:  Negative for chest pain.  Gastrointestinal:  Negative for abdominal pain, diarrhea, nausea and vomiting.  Neurological:  Negative for headaches.   Physical Exam Updated Vital Signs BP 131/87   Pulse (!) 59   Temp 97.6 F (36.4 C) (Oral)   Resp 13   SpO2 98%   Physical Exam Vitals and nursing note reviewed.  Constitutional:      General: He is not in acute distress.    Appearance: He is not ill-appearing.  HENT:     Head: Normocephalic.  Eyes:     Pupils: Pupils are equal, round, and reactive to light.  Cardiovascular:     Rate and Rhythm: Normal rate and regular rhythm.     Pulses:  Normal pulses.     Heart sounds: Normal heart sounds. No murmur heard.   No friction rub. No gallop.  Pulmonary:     Effort: Pulmonary effort is normal.     Breath sounds: Normal breath sounds.  Abdominal:     General: Abdomen is flat. There is no distension.     Palpations: Abdomen is soft.     Tenderness: There is no abdominal tenderness. There is no guarding or rebound.  Musculoskeletal:        General: Normal range of motion.     Cervical back: Neck supple.  Skin:    General: Skin is warm and dry.  Neurological:     General: No focal deficit present.     Mental Status: He is alert.     Comments: Speech is clear, able to follow commands CN III-XII intact Normal strength in upper and lower extremities bilaterally including dorsiflexion and plantar flexion, strong and equal grip strength Sensation grossly intact throughout Moves extremities without ataxia, coordination intact No pronator drift Ambulates without difficulty  Psychiatric:        Mood and Affect: Mood normal.        Behavior: Behavior normal.    ED Results / Procedures / Treatments   Labs (all labs ordered are listed, but only abnormal results are displayed) Labs Reviewed  CBC WITH DIFFERENTIAL/PLATELET - Abnormal; Notable for the following components:      Result Value   Hemoglobin 11.8 (*)    HCT 36.5 (*)    All other components within normal limits  BASIC METABOLIC PANEL - Abnormal; Notable for the following components:   Potassium 3.1 (*)    Glucose, Bld 142 (*)    Creatinine, Ser 1.29 (*)    GFR, Estimated 60 (*)    All other components within normal limits  TROPONIN I (HIGH SENSITIVITY)    EKG EKG Interpretation  Date/Time:  Thursday July 28 2021 03:53:56 EDT Ventricular Rate:  85 PR Interval:  184 QRS Duration: 88 QT Interval:  362 QTC Calculation: 430 R Axis:   64 Text Interpretation: Normal sinus rhythm No significant change since last tracing Confirmed by Lajean Saver [phone]) on  07/28/2021 12:10:18 PM  Radiology DG Chest 2 View  Result Date: 07/28/2021 CLINICAL DATA:  Lightheaded with shortness of breath. EXAM: CHEST - 2 VIEW COMPARISON:  Apr 02, 2018 FINDINGS: The heart size and mediastinal contours are within normal limits. Mild calcification of the aortic arch is noted. Both lungs are clear. The visualized skeletal structures are unremarkable. IMPRESSION: No active cardiopulmonary disease. Electronically Signed   By: Virgina Norfolk M.D.   On:  07/28/2021 04:29    Procedures Procedures   Medications Ordered in ED Medications  sodium chloride 0.9 % bolus 1,000 mL (1,000 mLs Intravenous Patient Refused/Not Given 07/28/21 1249)  potassium chloride SA (KLOR-CON) CR tablet 40 mEq (40 mEq Oral Given 07/28/21 1229)    ED Course  I have reviewed the triage vital signs and the nursing notes.  Pertinent labs & imaging results that were available during my care of the patient were reviewed by me and considered in my medical decision making (see chart for details).    MDM Rules/Calculators/A&P                           70 year old male presents to the ED due to lightheadedness and feeling unwell upon awakening in the middle of the night. No chest pain or shortness of breath. No nausea, vomiting, or diarrhea. Unfortunately, patient waited over 8 hours prior to my initial evaluation due to long wait times and notes his symptoms have mostly resolved. He also states his BP was elevated in the 150s when we woke up in the middle of the night. History of hypertension and has been compliant with his medications. Upon arrival, stable vitals. BP elevated at 162/78. Patient in no acute distress. Normal neurological exam. Low suspicion for CVA. No headaches, chest pain, or blurry vision. Low suspicion for hypertensive urgency or emergency. Labs, EKG, CXR and orthostatic vitals ordered at triage. IVFs ordered. Discussed case with Dr. Ashok Cordia who evaluated patient at bedside and agrees with  assessment and plan.   CBC reassuring without leukocytosis.  Anemia with hemoglobin 11.8.  BMP significant for hypokalemia at 3.1.  Hyperglycemia 142.  No anion gap. Elevated creatinine at 1.29. Patient declined IVFs. Oral potassium given. Troponin normal. EKG demonstrates NSR with no signs of acute ischemia. Low suspicion for cardiac etiology.  Chest x-ray negative for pneumonia, pneumothorax, or widened mediastinum. Orthostatic vitals WNL.   Upon reassessment, patient notes he feels better and is ready to go home. Patient stable for discharge. Strict ED precautions discussed with patient. Patient states understanding and agrees to plan. Patient discharged home in no acute distress and stable vitals  Final Clinical Impression(s) / ED Diagnoses Final diagnoses:  Hypertension, unspecified type    Rx / DC Orders ED Discharge Orders     None        Karie Kirks 07/28/21 1314    Lajean Saver, MD 07/29/21 1505

## 2021-08-02 DIAGNOSIS — Z0001 Encounter for general adult medical examination with abnormal findings: Secondary | ICD-10-CM | POA: Diagnosis not present

## 2021-08-02 DIAGNOSIS — I10 Essential (primary) hypertension: Secondary | ICD-10-CM | POA: Diagnosis not present

## 2021-08-02 DIAGNOSIS — Z6826 Body mass index (BMI) 26.0-26.9, adult: Secondary | ICD-10-CM | POA: Diagnosis not present

## 2021-08-02 DIAGNOSIS — R000 Tachycardia, unspecified: Secondary | ICD-10-CM | POA: Diagnosis not present

## 2021-08-02 DIAGNOSIS — E785 Hyperlipidemia, unspecified: Secondary | ICD-10-CM | POA: Diagnosis not present

## 2021-09-07 DIAGNOSIS — Z23 Encounter for immunization: Secondary | ICD-10-CM | POA: Diagnosis not present

## 2021-09-20 DIAGNOSIS — I10 Essential (primary) hypertension: Secondary | ICD-10-CM | POA: Diagnosis not present

## 2021-09-20 DIAGNOSIS — H608X2 Other otitis externa, left ear: Secondary | ICD-10-CM | POA: Diagnosis not present

## 2021-09-20 DIAGNOSIS — Z6825 Body mass index (BMI) 25.0-25.9, adult: Secondary | ICD-10-CM | POA: Diagnosis not present

## 2021-10-20 DIAGNOSIS — N182 Chronic kidney disease, stage 2 (mild): Secondary | ICD-10-CM | POA: Diagnosis not present

## 2021-10-20 DIAGNOSIS — E876 Hypokalemia: Secondary | ICD-10-CM | POA: Diagnosis not present

## 2021-10-20 DIAGNOSIS — Q615 Medullary cystic kidney: Secondary | ICD-10-CM | POA: Diagnosis not present

## 2021-10-20 DIAGNOSIS — I129 Hypertensive chronic kidney disease with stage 1 through stage 4 chronic kidney disease, or unspecified chronic kidney disease: Secondary | ICD-10-CM | POA: Diagnosis not present

## 2021-10-20 DIAGNOSIS — M109 Gout, unspecified: Secondary | ICD-10-CM | POA: Diagnosis not present

## 2021-10-20 DIAGNOSIS — D649 Anemia, unspecified: Secondary | ICD-10-CM | POA: Diagnosis not present

## 2021-11-07 ENCOUNTER — Ambulatory Visit
Admission: EM | Admit: 2021-11-07 | Discharge: 2021-11-07 | Disposition: A | Discharge status: Home / Self Care | Payer: Medicare Other

## 2021-11-07 ENCOUNTER — Other Ambulatory Visit: Payer: Self-pay

## 2021-11-07 DIAGNOSIS — H65192 Other acute nonsuppurative otitis media, left ear: Secondary | ICD-10-CM

## 2021-11-07 MED ORDER — FLUTICASONE PROPIONATE 50 MCG/ACT NA SUSP: 1.0000 | Freq: Every day | NASAL | 0 refills | Status: DC

## 2021-11-07 NOTE — ED Provider Notes (Signed)
[facility]    CSN: [id number] Arrival date & time: 11/07/21  8144      History   Chief Complaint Chief Complaint  Patient presents with   left ear pressure    HPI Scott Graves is a 70 y.o. male.   Patient presents with left ear pressure and discomfort that has been present for multiple weeks.  Patient reports that he was seen in November by PCP and was prescribed amoxicillin antibiotic as well as antibiotic eardrops for left ear infection.  He only took a few doses of the antibiotic as he left to go to a different state and did not take the medication with him.  Although, he reports that the left ear discomfort stopped for a few days and then returned.  Denies any trauma or foreign bodies to the ear.  Denies any drainage or decreased hearing.    Past Medical History:  Diagnosis Date   Abnormal EKG    Arthritis    Benign hypertensive heart disease without HF (heart failure)    Cataract    bil cataracts   Colon polyps    GERD (gastroesophageal reflux disease)    Gout    Hyperlipemia    Hypertension    Kidney stones    Neuromuscular disorder ([place])    neuropathy   Palpitations     Patient Active Problem List   Diagnosis Date Noted   Tennis elbow 07/14/2020   Impingement syndrome of left shoulder 01/27/2020   Olecranon bursitis, right elbow 09/24/2017   Gout 09/19/2016   Essential hypertension 09/19/2016   Paresthesia 09/19/2016   GERD (gastroesophageal reflux disease) 09/19/2016    Past Surgical History:  Procedure Laterality Date   ELBOW BURSA SURGERY Right 09/2017       Home Medications    Prior to Admission medications   Medication Sig Start Date End Date Taking? Authorizing Provider  fluticasone (FLONASE) 50 MCG/ACT nasal spray Place 1 spray into both nostrils daily for 3 days. 11/07/21 11/10/21 Yes Braelyn Jenson, Michele Rockers, FNP  potassium chloride (KLOR-CON M) 10 MEQ tablet  11/19/20  Yes [provider]  amLODipine (NORVASC) 10 MG tablet  Take 10 mg by mouth daily. 10/25/21   [provider]  DETROL LA 4 MG 24 hr capsule Take 4 mg by mouth daily. 10/21/21   [provider]  MICARDIS HCT 40-12.5 MG tablet  05/05/16   [provider]  pantoprazole (PROTONIX) 40 MG tablet Take 1 tablet (40 mg total) by mouth daily before breakfast. Must keep office appointment scheduled for 6/3 to continue refills 04/15/21   Levin Erp, PA  ZETIA 10 MG tablet  05/05/16   [provider]    Family History Family History  Problem Relation Age of Onset   Hypertension Mother    Diabetes Mother    Hyperlipidemia Mother    Hypertension Father    Hypertension Sister    Hyperlipidemia Sister    Heart disease Sister    Stroke Brother    Seizures Brother    Colon cancer Neg Hx    Stomach cancer Neg Hx    Esophageal cancer Neg Hx    Rectal cancer Neg Hx     Social History Social History   Tobacco Use   Smoking status: Never   Smokeless tobacco: Never  Vaping Use   Vaping Use: Never used  Substance Use Topics   Alcohol use: Yes    Comment: Rarely   Drug use: No  Allergies   Statins   Review of Systems Review of Systems Per HPI  Physical Exam Triage Vital Signs ED Triage Vitals [11/07/21 0827]  Enc Vitals Group     BP (!) 151/75     Pulse Rate 86     Resp 18     Temp 98 F (36.7 C)     Temp Source Oral     SpO2 99 %     Weight      Height      Head Circumference      Peak Flow      Pain Score 0     Pain Loc      Pain Edu?      Excl. in [place]?    No data found.  Updated Vital Signs BP (!) 151/75 (BP Location: Right Arm)    Pulse 86    Temp 98 F (36.7 C) (Oral)    Resp 18    SpO2 99%   Visual Acuity Right Eye Distance:   Left Eye Distance:   Bilateral Distance:    Right Eye Near:   Left Eye Near:    Bilateral Near:     Physical Exam Constitutional:      General: He is not in acute distress.    Appearance: Normal appearance. He is not toxic-appearing or  diaphoretic.  HENT:     Head: Normocephalic and atraumatic.     Left Ear: Ear canal and external ear normal. No decreased hearing noted. No swelling or tenderness. A middle ear effusion is present. Tympanic membrane is not perforated, erythematous or bulging.  Eyes:     Extraocular Movements: Extraocular movements intact.     Conjunctiva/sclera: Conjunctivae normal.  Pulmonary:     Effort: Pulmonary effort is normal.  Neurological:     General: No focal deficit present.     Mental Status: He is alert and oriented to person, place, and time. Mental status is at baseline.  Psychiatric:        Mood and Affect: Mood normal.        Behavior: Behavior normal.        Thought Content: Thought content normal.        Judgment: Judgment normal.     UC Treatments / Results  Labs (all labs ordered are listed, but only abnormal results are displayed) Labs Reviewed - No data to display  EKG   Radiology No results found.  Procedures Procedures (including critical care time)  Medications Ordered in UC Medications - No data to display  Initial Impression / Assessment and Plan / UC Course  I have reviewed the triage vital signs and the nursing notes.  Pertinent labs & imaging results that were available during my care of the patient were reviewed by me and considered in my medical decision making (see chart for details).     Patient has a left middle ear effusion that could be persistent after left ear infection.  Will treat with Flonase as patient takes Detrol and this can interact with antihistamine.  Unable to prescribe prednisone due to patient's chronic eye condition.  Patient has appointment with the ENT specialist in approximately 1 week.  Patient to follow-up with ENT specialist for further evaluation and management.  Patient verbalized understanding and was agreeable with plan. Final Clinical Impressions(s) / UC Diagnoses   Final diagnoses:  Acute MEE (middle ear effusion), left      Discharge Instructions      You have fluid in your  left ear.  This is being treated with Flonase nose spray.  I am unable to prescribe cetirizine (Zyrtec) as this interacts with your daily medications.  Please follow-up with your, nose, throat specialist for further evaluation and management.    ED Prescriptions     Medication Sig Dispense Auth. Provider   fluticasone (FLONASE) 50 MCG/ACT nasal spray Place 1 spray into both nostrils daily for 3 days. 16 g Teodora Medici, [place]      PDMP not reviewed this encounter.   Teodora Medici, [place] 11/07/21 [phone]

## 2021-11-07 NOTE — ED Triage Notes (Signed)
Pt c/o pressure in left ear. States in nov PCP gave ear drops/abx. Pt states he took 2 doses and left town forgetting the remainder. When he returned he did not know if he should continue with the abx so he did not. Also states has apt w/ ENT next week. Onset first week in nov.

## 2021-11-07 NOTE — Discharge Instructions (Signed)
You have fluid in your left ear.  This is being treated with Flonase nose spray.  I am unable to prescribe cetirizine (Zyrtec) as this interacts with your daily medications.  Please follow-up with your, nose, throat specialist for further evaluation and management.

## 2021-11-15 DIAGNOSIS — H9202 Otalgia, left ear: Secondary | ICD-10-CM | POA: Diagnosis not present

## 2021-11-27 DIAGNOSIS — Z20822 Contact with and (suspected) exposure to covid-19: Secondary | ICD-10-CM | POA: Diagnosis not present

## 2021-12-08 DIAGNOSIS — E7800 Pure hypercholesterolemia, unspecified: Secondary | ICD-10-CM | POA: Diagnosis not present

## 2021-12-08 DIAGNOSIS — R7309 Other abnormal glucose: Secondary | ICD-10-CM | POA: Diagnosis not present

## 2021-12-08 DIAGNOSIS — E1169 Type 2 diabetes mellitus with other specified complication: Secondary | ICD-10-CM | POA: Diagnosis not present

## 2021-12-08 DIAGNOSIS — I10 Essential (primary) hypertension: Secondary | ICD-10-CM | POA: Diagnosis not present

## 2021-12-08 DIAGNOSIS — N189 Chronic kidney disease, unspecified: Secondary | ICD-10-CM | POA: Diagnosis not present

## 2021-12-08 DIAGNOSIS — F064 Anxiety disorder due to known physiological condition: Secondary | ICD-10-CM | POA: Diagnosis not present

## 2021-12-10 DIAGNOSIS — I10 Essential (primary) hypertension: Secondary | ICD-10-CM | POA: Diagnosis not present

## 2021-12-10 DIAGNOSIS — E785 Hyperlipidemia, unspecified: Secondary | ICD-10-CM | POA: Diagnosis not present

## 2021-12-10 DIAGNOSIS — F064 Anxiety disorder due to known physiological condition: Secondary | ICD-10-CM | POA: Diagnosis not present

## 2021-12-10 DIAGNOSIS — E1169 Type 2 diabetes mellitus with other specified complication: Secondary | ICD-10-CM | POA: Diagnosis not present

## 2022-01-04 DIAGNOSIS — H2513 Age-related nuclear cataract, bilateral: Secondary | ICD-10-CM | POA: Diagnosis not present

## 2022-01-04 DIAGNOSIS — H40053 Ocular hypertension, bilateral: Secondary | ICD-10-CM | POA: Diagnosis not present

## 2022-01-04 DIAGNOSIS — H1013 Acute atopic conjunctivitis, bilateral: Secondary | ICD-10-CM | POA: Diagnosis not present

## 2022-01-12 DIAGNOSIS — D649 Anemia, unspecified: Secondary | ICD-10-CM | POA: Diagnosis not present

## 2022-01-12 DIAGNOSIS — N182 Chronic kidney disease, stage 2 (mild): Secondary | ICD-10-CM | POA: Diagnosis not present

## 2022-01-17 DIAGNOSIS — Q615 Medullary cystic kidney: Secondary | ICD-10-CM | POA: Diagnosis not present

## 2022-01-17 DIAGNOSIS — N182 Chronic kidney disease, stage 2 (mild): Secondary | ICD-10-CM | POA: Diagnosis not present

## 2022-01-17 DIAGNOSIS — I129 Hypertensive chronic kidney disease with stage 1 through stage 4 chronic kidney disease, or unspecified chronic kidney disease: Secondary | ICD-10-CM | POA: Diagnosis not present

## 2022-01-17 DIAGNOSIS — N179 Acute kidney failure, unspecified: Secondary | ICD-10-CM | POA: Diagnosis not present

## 2022-01-17 DIAGNOSIS — M109 Gout, unspecified: Secondary | ICD-10-CM | POA: Diagnosis not present

## 2022-01-17 DIAGNOSIS — D649 Anemia, unspecified: Secondary | ICD-10-CM | POA: Diagnosis not present

## 2022-01-18 ENCOUNTER — Other Ambulatory Visit: Payer: Self-pay | Admitting: Internal Medicine

## 2022-01-18 DIAGNOSIS — N179 Acute kidney failure, unspecified: Secondary | ICD-10-CM

## 2022-01-27 ENCOUNTER — Ambulatory Visit
Admission: RE | Admit: 2022-01-27 | Discharge: 2022-01-27 | Disposition: A | Discharge status: Home / Self Care | Payer: Medicare Other | Source: Ambulatory Visit | Attending: Internal Medicine | Admitting: Internal Medicine

## 2022-01-27 ENCOUNTER — Other Ambulatory Visit: Payer: Self-pay

## 2022-01-27 DIAGNOSIS — N179 Acute kidney failure, unspecified: Secondary | ICD-10-CM | POA: Diagnosis not present

## 2022-01-27 DIAGNOSIS — N3289 Other specified disorders of bladder: Secondary | ICD-10-CM | POA: Diagnosis not present

## 2022-02-06 DIAGNOSIS — R9349 Abnormal radiologic findings on diagnostic imaging of other urinary organs: Secondary | ICD-10-CM | POA: Diagnosis not present

## 2022-02-06 DIAGNOSIS — N401 Enlarged prostate with lower urinary tract symptoms: Secondary | ICD-10-CM | POA: Diagnosis not present

## 2022-02-06 DIAGNOSIS — R3912 Poor urinary stream: Secondary | ICD-10-CM | POA: Diagnosis not present

## 2022-05-01 DIAGNOSIS — N401 Enlarged prostate with lower urinary tract symptoms: Secondary | ICD-10-CM | POA: Diagnosis not present

## 2022-05-01 DIAGNOSIS — R3914 Feeling of incomplete bladder emptying: Secondary | ICD-10-CM | POA: Diagnosis not present

## 2022-05-05 DIAGNOSIS — E785 Hyperlipidemia, unspecified: Secondary | ICD-10-CM | POA: Diagnosis not present

## 2022-05-05 DIAGNOSIS — I10 Essential (primary) hypertension: Secondary | ICD-10-CM | POA: Diagnosis not present

## 2022-05-05 DIAGNOSIS — F064 Anxiety disorder due to known physiological condition: Secondary | ICD-10-CM | POA: Diagnosis not present

## 2022-05-05 DIAGNOSIS — E1169 Type 2 diabetes mellitus with other specified complication: Secondary | ICD-10-CM | POA: Diagnosis not present

## 2022-05-12 DIAGNOSIS — R7309 Other abnormal glucose: Secondary | ICD-10-CM | POA: Diagnosis not present

## 2022-05-12 DIAGNOSIS — I10 Essential (primary) hypertension: Secondary | ICD-10-CM | POA: Diagnosis not present

## 2022-05-12 DIAGNOSIS — R6884 Jaw pain: Secondary | ICD-10-CM | POA: Diagnosis not present

## 2022-05-12 DIAGNOSIS — E7800 Pure hypercholesterolemia, unspecified: Secondary | ICD-10-CM | POA: Diagnosis not present

## 2022-05-19 NOTE — Progress Notes (Unsigned)
Cardiology Office Note:    Date:  05/19/2022   ID:  Scott Graves, DOB May 18, 1951, MRN [id number]  PCP:  Lucianne Lei, MD   [facility] Providers Cardiologist:  Lenna Sciara, MD Referring MD: Lucianne Lei, MD   Chief Complaint/Reason for Referral: Hypertension  ASSESSMENT:    1. Essential hypertension   2. Hyperlipidemia, unspecified hyperlipidemia type   3. CKD (chronic kidney disease) stage 2, GFR 60-89 ml/min     PLAN:    In order of problems listed above:          {Are you ordering a CV Procedure (e.g. stress test, cath, DCCV, TEE, etc)?   Press F2        :[id number]}   Dispo:  No follow-ups on file.      Medication Adjustments/Labs and Tests Ordered: Current medicines are reviewed at length with the patient today.  Concerns regarding medicines are outlined above.  The following changes have been made:  {PLAN; NO CHANGE:13088:s}   Labs/tests ordered: No orders of the defined types were placed in this encounter.   Medication Changes: No orders of the defined types were placed in this encounter.    Current medicines are reviewed at length with the patient today.  The patient {ACTIONS; HAS/DOES NOT HAVE:19233} concerns regarding medicines.   History of Present Illness:    FOCUSED PROBLEM LIST:   1.  Hypertension 2.  Hyperlipidemia 3.  CKD stage II  The patient is a 71 y.o. male with the indicated medical history here for recommendations regarding blood pressure management.  The patient requested to be seen regarding blood pressure management issue.  He has not been seen here for several years.          Current Medications: No outpatient medications have been marked as taking for the 05/22/22 encounter (Appointment) with Early Osmond, MD.     Allergies:    Statins   Social History:   Social History   Tobacco Use   Smoking status: Never   Smokeless tobacco: Never  Vaping Use   Vaping Use: Never used  Substance Use Topics   Alcohol  use: Yes    Comment: Rarely   Drug use: No     Family Hx: Family History  Problem Relation Age of Onset   Hypertension Mother    Diabetes Mother    Hyperlipidemia Mother    Hypertension Father    Hypertension Sister    Hyperlipidemia Sister    Heart disease Sister    Stroke Brother    Seizures Brother    Colon cancer Neg Hx    Stomach cancer Neg Hx    Esophageal cancer Neg Hx    Rectal cancer Neg Hx      Review of Systems:   Please see the history of present illness.    All other systems reviewed and are negative.     EKGs/Labs/Other Test Reviewed:    EKG:  EKG performed 2022 that I personally reviewed demonstrates sinus rhythm with possible LVH; EKG performed today that I personally reviewed demonstrates ***.  Prior CV studies:  Event monitor 2019 without atrial fibrillation, ventricular tachyarrhythmias, or bradycardia.  TTE 2019: Ejection fraction 60 to 65% with no significant valvular abnormalities    Other studies Reviewed: Review of the additional studies/records demonstrates: None relevant  Recent Labs: 07/28/2021: BUN 17; Creatinine, Ser 1.29; Hemoglobin 11.8; Platelets 199; Potassium 3.1; Sodium 139   Recent Lipid Panel Lab Results  Component Value Date/Time   CHOL 187 06/07/2016  12:00 AM   TRIG 129 06/07/2016 12:00 AM   HDL 53 06/07/2016 12:00 AM   LDLCALC 108 06/07/2016 12:00 AM    Risk Assessment/Calculations:    {Does this patient have ATRIAL FIBRILLATION?:[phone]}      Physical Exam:    VS:  There were no vitals taken for this visit.   Wt Readings from Last 3 Encounters:  04/15/21 166 lb (75.3 kg)  07/14/20 176 lb (79.8 kg)  01/27/20 179 lb (81.2 kg)    GENERAL:  No apparent distress, AOx3 HEENT:  No carotid bruits, +2 carotid impulses, no scleral icterus CAR: RRR Irregular RR*** no murmurs***, gallops, rubs, or thrills RES:  Clear to auscultation bilaterally ABD:  Soft, nontender, nondistended, positive bowel sounds x 4 VASC:   +2 radial pulses, +2 carotid pulses, palpable pedal pulses NEURO:  CN 2-12 grossly intact; motor and sensory grossly intact PSYCH:  No active depression or anxiety EXT:  No edema, ecchymosis, or cyanosis  Signed, Early Osmond, MD  05/19/2022 1:18 PM    [facility] [place], [place], [place]  [address] Phone: [phone]; Fax: [phone]   Note:  This document was prepared using Dragon voice recognition software and may include unintentional dictation errors.

## 2022-05-22 ENCOUNTER — Ambulatory Visit (INDEPENDENT_AMBULATORY_CARE_PROVIDER_SITE_OTHER): Payer: Medicare Other | Admitting: Internal Medicine

## 2022-05-22 ENCOUNTER — Encounter: Payer: Self-pay | Admitting: Internal Medicine

## 2022-05-22 VITALS — BP 128/80 | HR 80 | Ht 67.0 in | Wt 158.6 lb

## 2022-05-22 DIAGNOSIS — E785 Hyperlipidemia, unspecified: Secondary | ICD-10-CM

## 2022-05-22 DIAGNOSIS — N182 Chronic kidney disease, stage 2 (mild): Secondary | ICD-10-CM

## 2022-05-22 DIAGNOSIS — I10 Essential (primary) hypertension: Secondary | ICD-10-CM | POA: Diagnosis not present

## 2022-05-22 MED ORDER — TELMISARTAN 20 MG PO TABS: 20.0000 mg | ORAL_TABLET | Freq: Every day | ORAL | 3 refills | Status: DC

## 2022-05-22 NOTE — Patient Instructions (Signed)
Medication Instructions:  Your physician has recommended you make the following change in your medication:   1) STOP Micardis 2) START Telmisartan '20mg'$  daily  *If you need a refill on your cardiac medications before your next appointment, please call your pharmacy*  Lab Work: NONE  Testing/Procedures: NONE  Follow-Up: At [facility], you and your health needs are our priority.  As part of our continuing mission to provide you with exceptional heart care, we have created designated Provider Care Teams.  These Care Teams include your primary Cardiologist (physician) and Advanced Practice Providers (APPs -  Physician Assistants and Nurse Practitioners) who all work together to provide you with the care you need, when you need it.  Your next appointment:   6 month(s)  The format for your next appointment:   In Person  Provider:   Robbie Lis, PA-C, Ambrose Pancoast, NP, Ermalinda Barrios, PA-C, Christen Bame, NP, or Richardson Dopp, PA-C   Important Information About Sugar

## 2022-07-19 DIAGNOSIS — Z23 Encounter for immunization: Secondary | ICD-10-CM | POA: Diagnosis not present

## 2022-07-31 ENCOUNTER — Telehealth: Payer: Self-pay | Admitting: Internal Medicine

## 2022-07-31 DIAGNOSIS — Z79899 Other long term (current) drug therapy: Secondary | ICD-10-CM

## 2022-07-31 DIAGNOSIS — H9202 Otalgia, left ear: Secondary | ICD-10-CM | POA: Diagnosis not present

## 2022-07-31 DIAGNOSIS — I10 Essential (primary) hypertension: Secondary | ICD-10-CM

## 2022-07-31 NOTE — Telephone Encounter (Signed)
Pt c/o BP issue: STAT if pt c/o blurred vision, one-sided weakness or slurred speech  1. What are your last 5 BP readings?  Systolic has been ranging 003-496 Diastolic ranging 11-64  2. Are you having any other symptoms (ex. Dizziness, headache, blurred vision, passed out)?  No   3. What is your BP issue?   Patient states his BP has been elevated. Saw his ENT specialist today and his BP was 158/89. ENT advised Telmisartan may need to be adjusted.

## 2022-08-01 NOTE — Telephone Encounter (Signed)
Early Osmond, MD  Rodman Key, RN Caller: Unspecified (Yesterday, 12:25 PM) I would monitor for now.  He can come in for BP check and if elevated above 130/80, telmisartan dose can be increased         Left message for patient to call back.

## 2022-08-01 NOTE — Telephone Encounter (Signed)
I spoke w the patient.  He voices understanding and will continue to monitor BP at home over next several days.  Feet flat on floor, still for 5-10 min.  Believes cuff is accurate as his spouse uses it too.  He was on Telmisartan-hctz 40-12.5 mg previously. He will call or write in w a few more readings later this week/early next week.    He said his BP was low on 40-12.5 and that is why it was stopped.    I adv PharmD could recommend possible med change/dose change in telmisartan vs adding hctz to 20 mg dose.  The readings he provided are once a day and about 2 hrs after his medication in the am: 140/84   then 158/89 at ENT same day 146/84 132/77 156/87

## 2022-08-04 MED ORDER — TELMISARTAN 40 MG PO TABS: 40.0000 mg | ORAL_TABLET | Freq: Every day | ORAL | 3 refills | Status: DC

## 2022-08-04 NOTE — Telephone Encounter (Signed)
   BP: 139/89 - 08/02/22 148/86 - 08/03/22 This morning 163/95 2:04 am -- took BP pills 163/86 - 5:50 am morning -- took BP pills again 158/85 - before calling  No symptoms   Pt is calling back to provide his BP readings

## 2022-08-04 NOTE — Telephone Encounter (Signed)
Spoke with pt and advised of Dr Dara Hoyer recommendation as below.  Pt states he will take 2 tablets of his current Telmisartan '20mg'$  tablets.  Pt advised will update medication in his MAR with new dose and he may request refill from pharmacy as needed.  Orders placed and lab appt scheduled for 08/11/2022.  Pt verbalizes understanding and agrees with current plan.     Early Osmond, MD  [facility] Triage 1 hour ago (9:05 AM)   AT Please increase telmisartan to 40 and check BMP in one week

## 2022-08-08 DIAGNOSIS — E785 Hyperlipidemia, unspecified: Secondary | ICD-10-CM | POA: Diagnosis not present

## 2022-08-08 DIAGNOSIS — Z0000 Encounter for general adult medical examination without abnormal findings: Secondary | ICD-10-CM | POA: Diagnosis not present

## 2022-08-08 DIAGNOSIS — G622 Polyneuropathy due to other toxic agents: Secondary | ICD-10-CM | POA: Diagnosis not present

## 2022-08-08 DIAGNOSIS — I129 Hypertensive chronic kidney disease with stage 1 through stage 4 chronic kidney disease, or unspecified chronic kidney disease: Secondary | ICD-10-CM | POA: Diagnosis not present

## 2022-08-08 DIAGNOSIS — N182 Chronic kidney disease, stage 2 (mild): Secondary | ICD-10-CM | POA: Diagnosis not present

## 2022-08-11 ENCOUNTER — Ambulatory Visit: Payer: Medicare Other | Attending: Internal Medicine

## 2022-08-11 DIAGNOSIS — I10 Essential (primary) hypertension: Secondary | ICD-10-CM | POA: Diagnosis not present

## 2022-08-11 DIAGNOSIS — Z79899 Other long term (current) drug therapy: Secondary | ICD-10-CM

## 2022-08-11 DIAGNOSIS — N182 Chronic kidney disease, stage 2 (mild): Secondary | ICD-10-CM | POA: Diagnosis not present

## 2022-08-11 DIAGNOSIS — D649 Anemia, unspecified: Secondary | ICD-10-CM | POA: Diagnosis not present

## 2022-08-11 DIAGNOSIS — M109 Gout, unspecified: Secondary | ICD-10-CM | POA: Diagnosis not present

## 2022-08-11 DIAGNOSIS — Q615 Medullary cystic kidney: Secondary | ICD-10-CM | POA: Diagnosis not present

## 2022-08-11 DIAGNOSIS — I129 Hypertensive chronic kidney disease with stage 1 through stage 4 chronic kidney disease, or unspecified chronic kidney disease: Secondary | ICD-10-CM | POA: Diagnosis not present

## 2022-08-12 LAB — BASIC METABOLIC PANEL
BUN/Creatinine Ratio: 13 (ref 10–24)
BUN: 16 mg/dL (ref 8–27)
CO2: 22 mmol/L (ref 20–29)
Calcium: 10 mg/dL (ref 8.6–10.2)
Chloride: 107 mmol/L — ABNORMAL HIGH (ref 96–106)
Creatinine, Ser: 1.24 mg/dL (ref 0.76–1.27)
Glucose: 110 mg/dL — ABNORMAL HIGH (ref 70–99)
Potassium: 4.2 mmol/L (ref 3.5–5.2)
Sodium: 143 mmol/L (ref 134–144)
eGFR: 62 mL/min/{1.73_m2} (ref >59)

## 2022-08-16 DIAGNOSIS — Z23 Encounter for immunization: Secondary | ICD-10-CM | POA: Diagnosis not present

## 2022-09-21 ENCOUNTER — Ambulatory Visit (INDEPENDENT_AMBULATORY_CARE_PROVIDER_SITE_OTHER): Payer: Medicare Other | Admitting: Physician Assistant

## 2022-09-21 ENCOUNTER — Encounter: Payer: Self-pay | Admitting: Physician Assistant

## 2022-09-21 VITALS — BP 130/88 | HR 80 | Ht 68.0 in | Wt 164.4 lb

## 2022-09-21 DIAGNOSIS — Z860101 Personal history of adenomatous and serrated colon polyps: Secondary | ICD-10-CM

## 2022-09-21 DIAGNOSIS — Z86010 Personal history of colonic polyps: Secondary | ICD-10-CM

## 2022-09-21 DIAGNOSIS — K219 Gastro-esophageal reflux disease without esophagitis: Secondary | ICD-10-CM | POA: Diagnosis not present

## 2022-09-21 DIAGNOSIS — D649 Anemia, unspecified: Secondary | ICD-10-CM

## 2022-09-21 DIAGNOSIS — R198 Other specified symptoms and signs involving the digestive system and abdomen: Secondary | ICD-10-CM | POA: Diagnosis not present

## 2022-09-21 MED ORDER — PLENVU 140 G PO SOLR: 140.0000 g | ORAL | 0 refills | Status: DC

## 2022-09-21 NOTE — Patient Instructions (Signed)
_______________________________________________________  If you are age 72 or older, your body mass index should be between 23-30. Your Body mass index is 25 kg/m. If this is out of the aforementioned range listed, please consider follow up with your Primary Care Provider.  If you are age 55 or younger, your body mass index should be between 19-25. Your Body mass index is 25 kg/m. If this is out of the aformentioned range listed, please consider follow up with your Primary Care Provider.   ________________________________________________________  The [place] [facility] providers would like to encourage you to use [facility] to communicate with providers for non-urgent requests or questions.  Due to long hold times on the telephone, sending your provider a message by [facility] may be a faster and more efficient way to get a response.  Please allow 48 business hours for a response.  Please remember that this is for non-urgent requests.  _______________________________________________________  Dennis Bast have been scheduled for a colonoscopy. Please follow written instructions given to you at your visit today.  Please pick up your prep supplies at the pharmacy within the next 1-3 days. If you use inhalers (even only as needed), please bring them with you on the day of your procedure.  Due to recent changes in healthcare laws, you may see the results of your imaging and laboratory studies on MyChart before your provider has had a chance to review them.  We understand that in some cases there may be results that are confusing or concerning to you. Not all laboratory results come back in the same time frame and the provider may be waiting for multiple results in order to interpret others.  Please give [place] 48 hours in order for your provider to thoroughly review all the results before contacting the office for clarification of your results.   It was a pleasure to see you today!  Thank you for trusting me with your  gastrointestinal care!

## 2022-09-21 NOTE — Progress Notes (Signed)
Chief Complaint: Abdominal Pain  HPI:    Scott Graves is a 71 year old Graves, with a past medical history as listed below including reflux, known to Dr. Silverio Decamp, who presents to clinic today with a complaint of abdominal pain.    04/15/2021 patient seen in clinic for reflux.  At that time was doing well as long as he took his Protonix 40 mg once a day.  At that time refilled Pantoprazole 40 mg daily.  Discussed following his PCP in regards to low hemoglobin.    08/22/2022 patient contacted our office and described excessive growling with gas in his abdomen.  At that time recommend that he continue Pantoprazole 40 mg daily and add Pepcid 20 mg at bedtime.    Today, the patient presents to clinic and tells me that about a month or so ago he and his wife both had an increase in bowel gas noises and sounds.  He tells me it was concerning to him as it was such a drastic difference.  Anytime he would move around he would hear things.  He had no other symptoms though, no abdominal pain, no change in bowel habits, no nausea or vomiting.  Tells me things seemed to calm down some now.  Around that time they were eating somecookies and drinking milk which was new for them as well as eating spinach.  He never did start the Pepcid as recommended because he read on the packaging it could affect his kidney function and "I am really watching that right now".  Does tell me he knows he is due for colonoscopy though and would like to schedule that while here.    Denies fever, chills, weight loss, blood in his stool or symptoms that awaken him from sleep.    Previous GI history: EGD 10/22/2019: Z-line regular, gastritis and erythematous duodenopathy ; pathology showed peptic duodenitis   Colonoscopy 10/22/2019: With 2 polyps in the sigmoid colon and cecum and 5 polyps in the transverse and ascending colon; pathology showed tubular adenoma and hyperplastic polyp repeat recommended in 3 years   Barium esophagogram was normal  2012   Colonoscopy 01/2003: Small transverse colon polyp adenomatous polyp   Colonoscopy 2009 : mild internal hemorrhoids   Colonoscopy 2012: Four sessile polyps removed from descending, transverse and ascending colon    Colonoscopy 12/29/2014: Dimunitive  Polyp removed from ascending colon, tubular adenoma, repeat recommended in 2021   EGD 2012: Small hiatal hernia with schatzki's ring, no dilation   Swallow study 2016 : mild pharyngeal dysphagia  Past Medical History:  Diagnosis Date   Abnormal EKG    Arthritis    Benign hypertensive heart disease without HF (heart failure)    Cataract    bil cataracts   Colon polyps    GERD (gastroesophageal reflux disease)    Gout    Hyperlipemia    Hypertension    Kidney stones    Neuromuscular disorder (HCC)    neuropathy   Palpitations     Past Surgical History:  Procedure Laterality Date   ELBOW BURSA SURGERY Right 09/2017    Current Outpatient Medications  Medication Sig Dispense Refill   pantoprazole (PROTONIX) 40 MG tablet Take 1 tablet (40 mg total) by mouth daily before breakfast. Must keep office appointment scheduled for 6/3 to continue refills 90 tablet 3   potassium chloride (KLOR-CON M) 10 MEQ tablet as needed.     telmisartan (MICARDIS) 40 MG tablet Take 1 tablet (40 mg total) by mouth daily. [place]  tablet 3   ZETIA 10 MG tablet      No current facility-administered medications for this visit.    Allergies as of 09/21/2022 - Review Complete 05/22/2022  Allergen Reaction Noted   Statins  06/08/2016    Family History  Problem Relation Age of Onset   Hypertension Mother    Diabetes Mother    Hyperlipidemia Mother    Hypertension Father    Hypertension Sister    Hyperlipidemia Sister    Heart disease Sister    Stroke Brother    Seizures Brother    Colon cancer Neg Hx    Stomach cancer Neg Hx    Esophageal cancer Neg Hx    Rectal cancer Neg Hx     Social History   Socioeconomic History   Marital status:  Married    Spouse name: Not on file   Number of children: 1   Years of education: HS   Highest education level: Not on file  Occupational History   Occupation: Retired  Tobacco Use   Smoking status: Never   Smokeless tobacco: Never  Vaping Use   Vaping Use: Never used  Substance and Sexual Activity   Alcohol use: Yes    Comment: Rarely   Drug use: No   Sexual activity: Not on file  Other Topics Concern   Not on file  Social History Narrative   Rare caffeine use   Denies abuse and feels safe at home.    Social Determinants of Health   Financial Resource Strain: Not on file  Food Insecurity: Not on file  Transportation Needs: Not on file  Physical Activity: Not on file  Stress: Not on file  Social Connections: Not on file  Intimate Partner Violence: Not on file    Review of Systems:    Constitutional: No weight loss, fever or chills Cardiovascular: No chest pain Respiratory: No SOB  Gastrointestinal: See HPI and otherwise negative   Physical Exam:  Vital signs: BP 130/88   Pulse 80   Ht '5\' 8"'$  (1.727 m)   Wt 164 lb 6.4 oz (74.6 kg)   SpO2 99%   BMI 25.00 kg/m    Constitutional:   Scott Graves appears to be in NAD, Well developed, Well nourished, alert and cooperative Respiratory: Respirations even and unlabored. Lungs clear to auscultation bilaterally.   No wheezes, crackles, or rhonchi.  Cardiovascular: Normal S1, S2. No MRG. Regular rate and rhythm. No peripheral edema, cyanosis or pallor.  Gastrointestinal:  Soft, nondistended, nontender. No rebound or guarding. Normal bowel sounds. No appreciable masses or hepatomegaly. Rectal:  Not performed.  Psychiatric: Oriented to person, place and time. Demonstrates good judgement and reason without abnormal affect or behaviors.  MOST RECENT LABS AND IMAGING: CBC    Component Value Date/Time   WBC 5.9 07/28/2021 0408   RBC 4.40 07/28/2021 0408   HGB 11.8 (L) 07/28/2021 0408   HCT 36.5 (L) 07/28/2021 0408    PLT 199 07/28/2021 0408   MCV 83.0 07/28/2021 0408   MCH 26.8 07/28/2021 0408   MCHC 32.3 07/28/2021 0408   RDW 12.8 07/28/2021 0408   LYMPHSABS 3.0 07/28/2021 0408   MONOABS 0.6 07/28/2021 0408   EOSABS 0.2 07/28/2021 0408   BASOSABS 0.1 07/28/2021 0408    CMP     Component Value Date/Time   NA 143 08/11/2022 1230   K 4.2 08/11/2022 1230   CL 107 (H) 08/11/2022 1230   CO2 22 08/11/2022 1230   GLUCOSE 110 (H)  08/11/2022 1230   GLUCOSE 142 (H) 07/28/2021 0408   BUN 16 08/11/2022 1230   CREATININE 1.24 08/11/2022 1230   CALCIUM 10.0 08/11/2022 1230   PROT 8.3 08/15/2018 1158   PROT 8.0 01/11/2018 0910   ALBUMIN 4.8 08/15/2018 1158   ALBUMIN 4.9 (H) 01/11/2018 0910   AST 17 08/15/2018 1158   ALT 11 08/15/2018 1158   ALKPHOS 81 08/15/2018 1158   BILITOT 0.5 08/15/2018 1158   BILITOT 0.5 01/11/2018 0910   GFRNONAA 60 (L) 07/28/2021 0408   GFRAA >60 04/02/2018 2053    Assessment: 1.  History of adenomatous polyps: Repeat colonoscopy recommended in December of this year 2.  GERD: Controlled on Pantoprazole 40 mg daily 3.  [place]mi: Increased for about a month timeframe while eating spicy cookies and drinking milk and spinach, likely related to diet 4.  Low hemoglobin: Patient tells me his PCP continues to watch this, again discussed that referral to hematology would be the next best step if she wants further work-up  Plan: 1.  Scheduled patient for surveillance colonoscopy in the [place] with Dr. Silverio Decamp.  Did provide the patient a detailed list risks for the procedure and he agrees to proceed. Patient is appropriate for endoscopic procedure(s) in the ambulatory ([place]) setting.  2.  Patient to continue Pantoprazole 40 mg daily. 3.  Patient to follow in clinic per recommendations after time of procedure with Dr. Silverio Decamp.  Ellouise Newer, PA-C [facility] 09/21/2022, 8:57 AM  Cc: Lucianne Lei, MD

## 2022-10-09 DIAGNOSIS — R7309 Other abnormal glucose: Secondary | ICD-10-CM | POA: Diagnosis not present

## 2022-10-09 DIAGNOSIS — E785 Hyperlipidemia, unspecified: Secondary | ICD-10-CM | POA: Diagnosis not present

## 2022-10-09 DIAGNOSIS — N182 Chronic kidney disease, stage 2 (mild): Secondary | ICD-10-CM | POA: Diagnosis not present

## 2022-10-09 DIAGNOSIS — I129 Hypertensive chronic kidney disease with stage 1 through stage 4 chronic kidney disease, or unspecified chronic kidney disease: Secondary | ICD-10-CM | POA: Diagnosis not present

## 2022-10-12 ENCOUNTER — Encounter: Payer: Self-pay | Admitting: Internal Medicine

## 2022-10-12 DIAGNOSIS — I129 Hypertensive chronic kidney disease with stage 1 through stage 4 chronic kidney disease, or unspecified chronic kidney disease: Secondary | ICD-10-CM | POA: Diagnosis not present

## 2022-10-12 DIAGNOSIS — K219 Gastro-esophageal reflux disease without esophagitis: Secondary | ICD-10-CM | POA: Diagnosis not present

## 2022-10-12 DIAGNOSIS — R7309 Other abnormal glucose: Secondary | ICD-10-CM | POA: Diagnosis not present

## 2022-10-12 DIAGNOSIS — E785 Hyperlipidemia, unspecified: Secondary | ICD-10-CM | POA: Diagnosis not present

## 2022-10-12 DIAGNOSIS — N182 Chronic kidney disease, stage 2 (mild): Secondary | ICD-10-CM | POA: Diagnosis not present

## 2022-10-12 DIAGNOSIS — E1122 Type 2 diabetes mellitus with diabetic chronic kidney disease: Secondary | ICD-10-CM | POA: Diagnosis not present

## 2022-10-12 DIAGNOSIS — D631 Anemia in chronic kidney disease: Secondary | ICD-10-CM | POA: Diagnosis not present

## 2022-10-13 NOTE — Telephone Encounter (Signed)
Dr. Silverio Decamp, would you please review pt's BP concerns. He is scheduled for a colonoscopy with you in the [place] on 10/24/22. Anderson Malta is out of the office. Thanks

## 2022-10-22 ENCOUNTER — Encounter: Payer: Self-pay | Admitting: Certified Registered Nurse Anesthetist

## 2022-10-24 ENCOUNTER — Encounter: Payer: Self-pay | Admitting: Gastroenterology

## 2022-10-24 ENCOUNTER — Ambulatory Visit (AMBULATORY_SURGERY_CENTER): Payer: Medicare Other | Admitting: Gastroenterology

## 2022-10-24 VITALS — BP 94/65 | HR 83 | Temp 97.3°F | Resp 16 | Ht 68.0 in | Wt 164.0 lb

## 2022-10-24 DIAGNOSIS — Z09 Encounter for follow-up examination after completed treatment for conditions other than malignant neoplasm: Secondary | ICD-10-CM

## 2022-10-24 DIAGNOSIS — D125 Benign neoplasm of sigmoid colon: Secondary | ICD-10-CM | POA: Diagnosis not present

## 2022-10-24 DIAGNOSIS — D123 Benign neoplasm of transverse colon: Secondary | ICD-10-CM | POA: Diagnosis not present

## 2022-10-24 DIAGNOSIS — K635 Polyp of colon: Secondary | ICD-10-CM | POA: Diagnosis not present

## 2022-10-24 DIAGNOSIS — Z86010 Personal history of colonic polyps: Secondary | ICD-10-CM | POA: Diagnosis not present

## 2022-10-24 DIAGNOSIS — Z860101 Personal history of adenomatous and serrated colon polyps: Secondary | ICD-10-CM

## 2022-10-24 DIAGNOSIS — D122 Benign neoplasm of ascending colon: Secondary | ICD-10-CM

## 2022-10-24 DIAGNOSIS — K219 Gastro-esophageal reflux disease without esophagitis: Secondary | ICD-10-CM | POA: Diagnosis not present

## 2022-10-24 DIAGNOSIS — I10 Essential (primary) hypertension: Secondary | ICD-10-CM | POA: Diagnosis not present

## 2022-10-24 MED ORDER — SODIUM CHLORIDE 0.9 % IV SOLN: 500.0000 mL | Freq: Once | INTRAVENOUS | Status: DC

## 2022-10-24 NOTE — Progress Notes (Signed)
[facility] History and Physical   Primary Care Physician:  Lucianne Lei, MD   Reason for Procedure:  History of adenomatous colon polyps  Plan:    Surveillance colonoscopy with possible interventions as needed     HPI: Scott Graves is a very pleasant 71 y.o. male here for surveillance colonoscopy. Denies any nausea, vomiting, abdominal pain, melena or bright red blood per rectum  The risks and benefits as well as alternatives of endoscopic procedure(s) have been discussed and reviewed. All questions answered. The patient agrees to proceed.    Past Medical History:  Diagnosis Date   Abnormal EKG    Arthritis    Benign hypertensive heart disease without HF (heart failure)    Cataract    bil cataracts   Colon polyps    GERD (gastroesophageal reflux disease)    Gout    Hyperlipemia    Hypertension    Kidney stones    Neuromuscular disorder (HCC)    neuropathy   Palpitations     Past Surgical History:  Procedure Laterality Date   ELBOW BURSA SURGERY Right 09/2017    Prior to Admission medications   Medication Sig Start Date End Date Taking? Authorizing Provider  pantoprazole (PROTONIX) 40 MG tablet Take 1 tablet (40 mg total) by mouth daily before breakfast. Must keep office appointment scheduled for 6/3 to continue refills 04/15/21  Yes Lemmon, Lavone Nian, PA  telmisartan (MICARDIS) 40 MG tablet Take 1 tablet (40 mg total) by mouth daily. 08/04/22  Yes Early Osmond, MD  ZETIA 10 MG tablet  05/05/16  Yes [provider]  allopurinol (ZYLOPRIM) 100 MG tablet Take 1 tablet by mouth daily. 09/30/18   [provider]  potassium chloride (KLOR-CON M) 10 MEQ tablet as needed. Patient not taking: Reported on 09/21/2022 11/19/20   [provider]  Vitamin D, Ergocalciferol, (DRISDOL) 1.25 MG (50000 UNIT) CAPS capsule Take by mouth.    [provider]    Current Outpatient Medications  Medication Sig Dispense Refill    pantoprazole (PROTONIX) 40 MG tablet Take 1 tablet (40 mg total) by mouth daily before breakfast. Must keep office appointment scheduled for 6/3 to continue refills 90 tablet 3   telmisartan (MICARDIS) 40 MG tablet Take 1 tablet (40 mg total) by mouth daily. 90 tablet 3   ZETIA 10 MG tablet      allopurinol (ZYLOPRIM) 100 MG tablet Take 1 tablet by mouth daily.     potassium chloride (KLOR-CON M) 10 MEQ tablet as needed. (Patient not taking: Reported on 09/21/2022)     Vitamin D, Ergocalciferol, (DRISDOL) 1.25 MG (50000 UNIT) CAPS capsule Take by mouth.     Current Facility-Administered Medications  Medication Dose Route Frequency Provider Last Rate Last Admin   0.9 %  sodium chloride infusion  500 mL Intravenous Once Mauri Pole, MD        Allergies as of 10/24/2022 - Review Complete 10/24/2022  Allergen Reaction Noted   Statins  06/08/2016    Family History  Problem Relation Age of Onset   Hypertension Mother    Diabetes Mother    Hyperlipidemia Mother    Hypertension Father    Hypertension Sister    Hyperlipidemia Sister    Heart disease Sister    Stroke Brother    Seizures Brother    Colon cancer Neg Hx    Stomach cancer Neg Hx    Esophageal cancer Neg Hx    Rectal cancer Neg Hx  Social History   Socioeconomic History   Marital status: Married    Spouse name: Not on file   Number of children: 1   Years of education: HS   Highest education level: Not on file  Occupational History   Occupation: Retired  Tobacco Use   Smoking status: Never   Smokeless tobacco: Never  Vaping Use   Vaping Use: Never used  Substance and Sexual Activity   Alcohol use: Yes    Comment: Rarely   Drug use: No   Sexual activity: Not on file  Other Topics Concern   Not on file  Social History Narrative   Rare caffeine use   Denies abuse and feels safe at home.    Social Determinants of Health   Financial Resource Strain: Not on file  Food Insecurity: Not on file   Transportation Needs: Not on file  Physical Activity: Not on file  Stress: Not on file  Social Connections: Not on file  Intimate Partner Violence: Not on file    Review of Systems:  All other review of systems negative except as mentioned in the HPI.  Physical Exam: Vital signs in last 24 hours: Blood Pressure 101/73   Pulse (Abnormal) 104   Temperature (Abnormal) 97.3 F (36.3 C)   Height '5\' 8"'$  (1.727 m)   Weight 164 lb (74.4 kg)   Oxygen Saturation 100%   Body Mass Index 24.94 kg/m  General:   Alert, NAD Lungs:  Clear .   Heart:  Regular rate and rhythm Abdomen:  Soft, nontender and nondistended. Neuro/Psych:  Alert and cooperative. Normal mood and affect. A and O x 3  Reviewed labs, radiology imaging, old records and pertinent past GI work up  Patient is appropriate for planned procedure(s) and anesthesia in an ambulatory setting   K. Denzil Magnuson , MD [phone]

## 2022-10-24 NOTE — Op Note (Signed)
[place] Patient Name: Scott Graves Procedure Date: 10/24/2022 1:43 PM MRN: [id number] Endoscopist: Mauri Pole , MD, [id number] Age: 71 Referring MD:  Date of Birth: June 28, 1951 Gender: Male Account #: [id number] Procedure:                Colonoscopy Indications:              High risk colon cancer surveillance: Personal                            history of colonic polyps, High risk colon cancer                            surveillance: Personal history of adenoma (10 mm or                            greater in size), High risk colon cancer                            surveillance: Personal history of multiple (3 or                            more) adenomas Medicines:                Monitored Anesthesia Care Procedure:                Pre-Anesthesia Assessment:                           - Prior to the procedure, a History and Physical                            was performed, and patient medications and                            allergies were reviewed. The patient's tolerance of                            previous anesthesia was also reviewed. The risks                            and benefits of the procedure and the sedation                            options and risks were discussed with the patient.                            All questions were answered, and informed consent                            was obtained. Prior Anticoagulants: The patient has                            taken no anticoagulant or antiplatelet agents. ASA  Grade Assessment: II - A patient with mild systemic                            disease. After reviewing the risks and benefits,                            the patient was deemed in satisfactory condition to                            undergo the procedure.                           After obtaining informed consent, the colonoscope                            was passed under direct vision. Throughout the                             procedure, the patient's blood pressure, pulse, and                            oxygen saturations were monitored continuously. The                            PCF-HQ190L Colonoscope was introduced through the                            anus and advanced to the the cecum, identified by                            appendiceal orifice and ileocecal valve. The                            colonoscopy was performed without difficulty. The                            patient tolerated the procedure well. The quality                            of the bowel preparation was good. The ileocecal                            valve, appendiceal orifice, and rectum were                            photographed. Scope In: 1:48:38 PM Scope Out: 2:10:09 PM Scope Withdrawal Time: 0 hours 15 minutes 55 seconds  Total Procedure Duration: 0 hours 21 minutes 31 seconds  Findings:                 The perianal and digital rectal examinations were                            normal.  Five sessile polyps were found in the sigmoid                            colon, transverse colon and ascending colon. The                            polyps were 4 to 7 mm in size. These polyps were                            removed with a cold snare. Resection and retrieval                            were complete.                           Scattered small-mouthed diverticula were found in                            the sigmoid colon, transverse colon and ascending                            colon.                           Non-bleeding external and internal hemorrhoids were                            found during retroflexion. The hemorrhoids were                            medium-sized. Complications:            No immediate complications. Estimated Blood Loss:     Estimated blood loss was minimal. Impression:               - Five 4 to 7 mm polyps in the sigmoid colon, in                             the transverse colon and in the ascending colon,                            removed with a cold snare. Resected and retrieved.                           - Diverticulosis in the sigmoid colon, in the                            transverse colon and in the ascending colon.                           - Non-bleeding external and internal hemorrhoids. Recommendation:           - Patient has a contact number available for  emergencies. The signs and symptoms of potential                            delayed complications were discussed with the                            patient. Return to normal activities tomorrow.                            Written discharge instructions were provided to the                            patient.                           - Resume previous diet.                           - Continue present medications.                           - Await pathology results.                           - Repeat colonoscopy in 3 - 5 years for                            surveillance based on pathology results. Mauri Pole, MD 10/24/2022 2:15:03 PM This report has been signed electronically.

## 2022-10-24 NOTE — Progress Notes (Signed)
Report given to PACU, vss 

## 2022-10-24 NOTE — Progress Notes (Signed)
1407 Ephedrine 10 mg given IV due to low BP, MD updated.

## 2022-10-24 NOTE — Progress Notes (Signed)
Called to room to assist during endoscopic procedure.  Patient ID and intended procedure confirmed with present staff. Received instructions for my participation in the procedure from the performing physician.  

## 2022-10-24 NOTE — Progress Notes (Signed)
Pt's states no medical or surgical changes since previsit or office visit. 

## 2022-10-24 NOTE — Patient Instructions (Signed)
HANDOUTS PROVIDED ON: POLYPS, DIVERTICULOSIS, & HEMORRHOIDS  The polyps removed today have been sent for pathology.  The results can take 1-3 weeks to receive.  When your next colonoscopy should occur will be based on the pathology results.    You may resume your previous diet and medication schedule.  Thank you for allowing us to care for you today!!!   YOU HAD AN ENDOSCOPIC PROCEDURE TODAY AT THE [facility]:   Refer to the procedure report that was given to you for any specific questions about what was found during the examination.  If the procedure report does not answer your questions, please call your gastroenterologist to clarify.  If you requested that your care partner not be given the details of your procedure findings, then the procedure report has been included in a sealed envelope for you to review at your convenience later.  YOU SHOULD EXPECT: Some feelings of bloating in the abdomen. Passage of more gas than usual.  Walking can help get rid of the air that was put into your GI tract during the procedure and reduce the bloating. If you had a lower endoscopy (such as a colonoscopy or flexible sigmoidoscopy) you may notice spotting of blood in your stool or on the toilet paper. If you underwent a bowel prep for your procedure, you may not have a normal bowel movement for a few days.  Please Note:  You might notice some irritation and congestion in your nose or some drainage.  This is from the oxygen used during your procedure.  There is no need for concern and it should clear up in a day or so.  SYMPTOMS TO REPORT IMMEDIATELY:  Following lower endoscopy (colonoscopy or flexible sigmoidoscopy):  Excessive amounts of blood in the stool  Significant tenderness or worsening of abdominal pains  Swelling of the abdomen that is new, acute  Fever of 100F or higher  For urgent or emergent issues, a gastroenterologist can be reached at any hour by calling [phone]. Do  not use MyChart messaging for urgent concerns.    DIET:  We do recommend a small meal at first, but then you may proceed to your regular diet.  Drink plenty of fluids but you should avoid alcoholic beverages for 24 hours.  ACTIVITY:  You should plan to take it easy for the rest of today and you should NOT DRIVE or use heavy machinery until tomorrow (because of the sedation medicines used during the test).    FOLLOW UP: Our staff will call the number listed on your records the next business day following your procedure.  We will call around 7:15- 8:00 am to check on you and address any questions or concerns that you may have regarding the information given to you following your procedure. If we do not reach you, we will leave a message.     If any biopsies were taken you will be contacted by phone or by letter within the next 1-3 weeks.  Please call us at [phone] if you have not heard about the biopsies in 3 weeks.    SIGNATURES/CONFIDENTIALITY: You and/or your care partner have signed paperwork which will be entered into your electronic medical record.  These signatures attest to the fact that that the information above on your After Visit Summary has been reviewed and is understood.  Full responsibility of the confidentiality of this discharge information lies with you and/or your care-partner.   

## 2022-10-25 ENCOUNTER — Telehealth: Payer: Self-pay | Admitting: *Deleted

## 2022-10-25 NOTE — Telephone Encounter (Signed)
  Follow up Call-     10/24/2022    1:12 PM  Call back number  Post procedure Call Back phone  # [phone]  Permission to leave phone message Yes     Patient questions:  Do you have a fever, pain , or abdominal swelling? No. Pain Score  0 *  Have you tolerated food without any problems? Yes.    Have you been able to return to your normal activities? Yes.    Do you have any questions about your discharge instructions: Diet   No. Medications  No. Follow up visit  No.  Do you have questions or concerns about your Care? No.  Actions: * If pain score is 4 or above: No action needed, pain <4.

## 2022-11-02 DIAGNOSIS — I129 Hypertensive chronic kidney disease with stage 1 through stage 4 chronic kidney disease, or unspecified chronic kidney disease: Secondary | ICD-10-CM | POA: Diagnosis not present

## 2022-11-02 DIAGNOSIS — N189 Chronic kidney disease, unspecified: Secondary | ICD-10-CM | POA: Diagnosis not present

## 2022-11-02 DIAGNOSIS — E1122 Type 2 diabetes mellitus with diabetic chronic kidney disease: Secondary | ICD-10-CM | POA: Diagnosis not present

## 2022-11-08 ENCOUNTER — Encounter: Payer: Self-pay | Admitting: Gastroenterology

## 2022-12-23 NOTE — Progress Notes (Unsigned)
Cardiology Office Note:    Date:  12/25/2022   ID:  Scott Graves, DOB Sep 26, 1951, MRN [id number]  PCP:  Lucianne Lei, MD   [facility] Providers Cardiologist:  None     Referring MD: Lucianne Lei, MD   Chief Complaint: follow-up hypertension  History of Present Illness:    Scott Graves is a very pleasant 72 y.o. male with a hx of HTN, hyperlipidemia, and CKD.  He was seen in cardiology clinic 05/22/2022 by Dr. Ali Lowe for management of hypertension. Previously seen by Dr. Johnsie Cancel for management of palpitations in December 2019.  He wore an event monitor with no evidence of atrial fibrillation, ventricular tachyarrhythmias, or bradycardia. Had TTE 2019 with ejection fraction 60 to 65% with no significant valvular abnormalities.  At office visit 05/22/2022 he reported recent visit with PCP with BP 80/60 mmHg.  No blood pressure medication changes were made. He was seen by nephrology.  His Micardis was discontinued and it was suggested that he start telmisartan 20 mg daily. He had previously been on Micardis 40/12.5 mg. He was concerned and did not change medications.  Reported he had not been exercising as much recently.  BP was 128/80, pulse 80.  He was advised to stop Micardis and start telmisartan 20 mg daily and return in 6 months for follow-up.  He contacted our office 07/31/2022 to report SBP 138-148, DBP 85-87. Saw ENT and BP was 158/89.  Appropriate BP technique was reviewed and he was advised to monitor for a longer period of time.  He called back on 08/04/2022 to report consistently elevated BP.  He was advised to increase telmisartan to 40 mg daily and have BMP in 1 week which revealed stable kidney function and electrolytes.  Today, he is here for evaluation of fluctuations in BP. Saw PCP in December and BP was elevated, resumed Micardis 40-12.5 mg, labs monitored by PCP per his report. BP lower at home - 111 lowest SBP, 60s lowest DBP. Now seeing nephrology every 6 months rather  than yearly. Reports he is overall feeling well. Occasional weight lifting, has stationary bike admits to not riding regularly as he should. Only gets lightheaded when he stands too quickly. He denies chest pain, shortness of breath, lower extremity edema, fatigue, palpitations, melena, hematuria, hemoptysis, diaphoresis, weakness, presyncope, syncope, orthopnea, and PND.   Past Medical History:  Diagnosis Date   Abnormal EKG    Arthritis    Benign hypertensive heart disease without HF (heart failure)    Cataract    bil cataracts   Colon polyps    GERD (gastroesophageal reflux disease)    Gout    Hyperlipemia    Hypertension    Kidney stones    Neuromuscular disorder (HCC)    neuropathy   Palpitations     Past Surgical History:  Procedure Laterality Date   ELBOW BURSA SURGERY Right 09/2017    Current Medications: Current Meds  Medication Sig   allopurinol (ZYLOPRIM) 100 MG tablet Take 1 tablet by mouth daily.   MICARDIS HCT 40-12.5 MG tablet Take 1 tablet by mouth daily.   pantoprazole (PROTONIX) 40 MG tablet Take 1 tablet (40 mg total) by mouth daily before breakfast. Must keep office appointment scheduled for 6/3 to continue refills   potassium chloride (KLOR-CON M) 10 MEQ tablet Take 10 mEq by mouth as needed.   rosuvastatin (CRESTOR) 5 MG tablet Take 1 tablet (5 mg total) by mouth daily.   Vitamin D, Ergocalciferol, (DRISDOL) 1.25 MG (50000 UNIT)  CAPS capsule Take by mouth every 7 (seven) days.   ZETIA 10 MG tablet Take 10 mg by mouth daily.   [DISCONTINUED] telmisartan (MICARDIS) 40 MG tablet Take 1 tablet (40 mg total) by mouth daily.     Allergies:   Statins   Social History   Socioeconomic History   Marital status: Married    Spouse name: Not on file   Number of children: 1   Years of education: HS   Highest education level: Not on file  Occupational History   Occupation: Retired  Tobacco Use   Smoking status: Never   Smokeless tobacco: Never  Vaping Use    Vaping Use: Never used  Substance and Sexual Activity   Alcohol use: Yes    Comment: Rarely   Drug use: No   Sexual activity: Not on file  Other Topics Concern   Not on file  Social History Narrative   Rare caffeine use   Denies abuse and feels safe at home.    Social Determinants of Health   Financial Resource Strain: Not on file  Food Insecurity: Not on file  Transportation Needs: Not on file  Physical Activity: Not on file  Stress: Not on file  Social Connections: Not on file     Family History: The patient's family history includes Diabetes in his mother; Heart disease in his sister; Hyperlipidemia in his mother and sister; Hypertension in his father, mother, and sister; Seizures in his brother; Stroke in his brother. There is no history of Colon cancer, Stomach cancer, Esophageal cancer, or Rectal cancer.  ROS:   Please see the history of present illness.  All other systems reviewed and are negative.  Labs/Other Studies Reviewed:    The following studies were reviewed today:  Cardiac monitor 04/25/18 Triggered events correlate with sinus rhythm No afib, no tachyarrhythmias or pauses  Echo  LVEF 56%, mild LVH, no wma Normal RV No significant valve disease   Recent Labs: 08/11/2022: BUN 16; Creatinine, Ser 1.24; Potassium 4.2; Sodium 143  Recent Lipid Panel    Component Value Date/Time   CHOL 187 06/07/2016 0000   TRIG 129 06/07/2016 0000   HDL 53 06/07/2016 0000   LDLCALC 108 06/07/2016 0000     Risk Assessment/Calculations:      Physical Exam:    VS:  BP 128/72   Pulse 79   Ht 5' 8"$  (1.727 m)   Wt 168 lb 9.6 oz (76.5 kg)   SpO2 95%   BMI 25.64 kg/m     Wt Readings from Last 3 Encounters:  12/25/22 168 lb 9.6 oz (76.5 kg)  10/24/22 164 lb (74.4 kg)  09/21/22 164 lb 6.4 oz (74.6 kg)     GEN: Well nourished, well developed in no acute distress HEENT: Normal NECK: No JVD; No carotid bruits CARDIAC: RRR, no murmurs, rubs,  gallops RESPIRATORY:  Clear to auscultation without rales, wheezing or rhonchi  ABDOMEN: Soft, non-tender, non-distended MUSCULOSKELETAL:  No edema; No deformity. 2+ pedal pulses, equal bilaterally SKIN: Warm and dry NEUROLOGIC:  Alert and oriented x 3 PSYCHIATRIC:  Normal affect   EKG:  EKG is ordered today.  The ekg ordered today demonstrates sinus rhythm at 79 bpm with PVCs, minimal voltage criteria for LVH, early repolarization   Diagnoses:    1. Essential hypertension   2. CKD (chronic kidney disease) stage 2, GFR 60-89 ml/min   3. Hyperlipidemia LDL goal <100    Assessment and Plan:     Hypertension: BP is  well controlled today. He resumed Micardis 40-12.5 mg approximately 2 months ago and is tolerating well. He reports kidney function is being monitored by PCP and nephrology. Per report, home BP is well controlled. No medication changes recommended.   Hyperlipidema: LDL 128 on 10/09/22. Currently on Zetia 10 mg daily. Recalls past intolerances of statins but is unsure of what agents.  Is willing to start rosuvastatin 5 mg daily and have follow-up fasting lipid/LFT in 2 months. Plans to complete lab work at PCP office. Encouraged him to notify us if he does not tolerate rosuvastatin.  CKD: Scr 1.33 on 10/09/22. Follows regularly with nephrology. Continue good hydration. No medication changes today.      Disposition: 6 months with Dr. Ali Lowe  Medication Adjustments/Labs and Tests Ordered: Current medicines are reviewed at length with the patient today.  Concerns regarding medicines are outlined above.  Orders Placed This Encounter  Procedures   EKG 12-Lead   Meds ordered this encounter  Medications   rosuvastatin (CRESTOR) 5 MG tablet    Sig: Take 1 tablet (5 mg total) by mouth daily.    Dispense:  30 tablet    Refill:  5    Patient Instructions  Medication Instructions:  START Crestor 34m take 1 tablet once a day  *If you need a refill on your cardiac medications  before your next appointment, please call your pharmacy*  Lab Work: None Ordered  Testing/Procedures: None ordered  Follow-Up: [facility] you and your health needs are our priority.  As part of our continuing mission to provide you with exceptional heart care, we have created designated Provider Care Teams.  These Care Teams include your primary Cardiologist (physician) and Advanced Practice Providers (APPs -  Physician Assistants and Nurse Practitioners) who all work together to provide you with the care you need, when you need it.  We recommend signing up for the patient portal called "MyChart".  Sign up information is provided on this After Visit Summary.  MyChart is used to connect with patients for Virtual Visits (Telemedicine).  Patients are able to view lab/test results, encounter notes, upcoming appointments, etc.  Non-urgent messages can be sent to your provider as well.   To learn more about what you can do with MyChart, go to [email or web address]    Your next appointment:   6 month(s)  Provider:   AEarly Osmond MD   Other Instructions     Signed, SEmmaline Life NP  12/25/2022 12:04 PM    CMcNeil

## 2022-12-25 ENCOUNTER — Ambulatory Visit: Payer: Medicare Other | Attending: Nurse Practitioner | Admitting: Nurse Practitioner

## 2022-12-25 ENCOUNTER — Encounter: Payer: Self-pay | Admitting: Nurse Practitioner

## 2022-12-25 VITALS — BP 128/72 | HR 79 | Ht 68.0 in | Wt 168.6 lb

## 2022-12-25 DIAGNOSIS — N182 Chronic kidney disease, stage 2 (mild): Secondary | ICD-10-CM | POA: Diagnosis not present

## 2022-12-25 DIAGNOSIS — I10 Essential (primary) hypertension: Secondary | ICD-10-CM | POA: Insufficient documentation

## 2022-12-25 DIAGNOSIS — E785 Hyperlipidemia, unspecified: Secondary | ICD-10-CM | POA: Insufficient documentation

## 2022-12-25 MED ORDER — ROSUVASTATIN CALCIUM 5 MG PO TABS: 5.0000 mg | ORAL_TABLET | Freq: Every day | ORAL | 5 refills | Status: DC

## 2022-12-25 NOTE — Patient Instructions (Addendum)
Medication Instructions:  START Crestor 91m take 1 tablet once a day  *If you need a refill on your cardiac medications before your next appointment, please call your pharmacy*  Lab Work: None Ordered  Testing/Procedures: None ordered  Follow-Up: At [facility] you and your health needs are our priority.  As part of our continuing mission to provide you with exceptional heart care, we have created designated Provider Care Teams.  These Care Teams include your primary Cardiologist (physician) and Advanced Practice Providers (APPs -  Physician Assistants and Nurse Practitioners) who all work together to provide you with the care you need, when you need it.  We recommend signing up for the patient portal called "MyChart".  Sign up information is provided on this After Visit Summary.  MyChart is used to connect with patients for Virtual Visits (Telemedicine).  Patients are able to view lab/test results, encounter notes, upcoming appointments, etc.  Non-urgent messages can be sent to your provider as well.   To learn more about what you can do with MyChart, go to [email or web address]    Your next appointment:   6 month(s)  Provider:   AEarly Osmond MD   Other Instructions

## 2023-01-01 ENCOUNTER — Other Ambulatory Visit: Payer: Self-pay

## 2023-01-01 MED ORDER — ROSUVASTATIN CALCIUM 5 MG PO TABS: 5.0000 mg | ORAL_TABLET | Freq: Every day | ORAL | 5 refills | Status: DC

## 2023-01-01 MED ORDER — PANTOPRAZOLE SODIUM 40 MG PO TBEC: 40.0000 mg | DELAYED_RELEASE_TABLET | Freq: Every day | ORAL | 3 refills | Status: DC

## 2023-01-10 DIAGNOSIS — H2513 Age-related nuclear cataract, bilateral: Secondary | ICD-10-CM | POA: Diagnosis not present

## 2023-01-10 DIAGNOSIS — H40053 Ocular hypertension, bilateral: Secondary | ICD-10-CM | POA: Diagnosis not present

## 2023-01-10 DIAGNOSIS — H1013 Acute atopic conjunctivitis, bilateral: Secondary | ICD-10-CM | POA: Diagnosis not present

## 2023-01-18 DIAGNOSIS — N182 Chronic kidney disease, stage 2 (mild): Secondary | ICD-10-CM | POA: Diagnosis not present

## 2023-01-18 DIAGNOSIS — M109 Gout, unspecified: Secondary | ICD-10-CM | POA: Diagnosis not present

## 2023-01-18 DIAGNOSIS — I129 Hypertensive chronic kidney disease with stage 1 through stage 4 chronic kidney disease, or unspecified chronic kidney disease: Secondary | ICD-10-CM | POA: Diagnosis not present

## 2023-01-18 DIAGNOSIS — D649 Anemia, unspecified: Secondary | ICD-10-CM | POA: Diagnosis not present

## 2023-01-18 DIAGNOSIS — Q615 Medullary cystic kidney: Secondary | ICD-10-CM | POA: Diagnosis not present

## 2023-02-02 ENCOUNTER — Ambulatory Visit (INDEPENDENT_AMBULATORY_CARE_PROVIDER_SITE_OTHER): Payer: Medicare Other | Admitting: Surgical

## 2023-02-02 ENCOUNTER — Other Ambulatory Visit (INDEPENDENT_AMBULATORY_CARE_PROVIDER_SITE_OTHER): Payer: Medicare Other

## 2023-02-02 DIAGNOSIS — M25511 Pain in right shoulder: Secondary | ICD-10-CM

## 2023-02-02 DIAGNOSIS — M25512 Pain in left shoulder: Secondary | ICD-10-CM

## 2023-02-03 ENCOUNTER — Encounter: Payer: Self-pay | Admitting: Surgical

## 2023-02-03 NOTE — Progress Notes (Signed)
Office Visit Note   Patient: Scott Graves           Date of Birth: 08-22-1951           MRN: [id number] Visit Date: 02/02/2023 Requested by: Lucianne Lei, MD [address] [place],  [place] [address] PCP: Lucianne Lei, MD  Subjective: Chief Complaint  Patient presents with   Right Shoulder - Pain   Left Shoulder - Pain    HPI: Scott Graves is a 72 y.o. male who presents to the office reporting bilateral shoulder pain.  Patient states that on March 7, he woke up with pain in both shoulders.  Localizes pain to the lateral aspect of each shoulder as well as the anterior aspect of each shoulder.  He denies any significant problem with his shoulders in the past.  He cannot recall any specific injury.  He states that he was recently doing a lot of overhead work while working on his house but he feels that all of this was after pain initially came on.  He denies any radicular pain down the arm or any new mechanical symptoms.  No weakness.  He has noticed some achiness in his neck as well as trapezius pain on either side which is abnormal for him.  No numbness or tingling.  No restriction in his range of motion.  He has no prior surgery on his neck or shoulders and no history of shoulder dislocation.  He also notes some pain in the anterior aspect of his neck that seems to localize to his sternoclavicular joint on the left but not on the right.  He does not take any medications for this as he has a history of CKD which is overall mild but he wants to make sure that this does not progress.  No history of diabetes or blood thinner use.  He is using heat and enjoys standing still in a hot shower which helps his shoulder pain.  It is not waking him up at night.  Is not really preventing him from doing anything that he wants to do at this point..                ROS: All systems reviewed are negative as they relate to the chief complaint within the history of present illness.  Patient denies fevers or  chills.  Assessment & Plan: Visit Diagnoses:  1. Acute pain of both shoulders     Plan: Patient is a pleasant 72 year old male who presents for evaluation of bilateral shoulder pain.  It all began when he awoke on March 7 and noticed some achiness in his neck and pain in his lateral shoulders.  No prior injury or incident.  He has radiographs of both shoulders taken today demonstrating no significant pathology but he does have some degenerative changes that are noted on cervical spine radiographs.  With lack of incident and this new aching sensation in his neck, suspect that he may have some contribution of this shoulder pain coming from the cervical spine.  Also may have some pain from the shoulders themselves that seems more like it could be bicep tendinitis or labral pathology given his positive O'Brien sign and tenderness over the bicipital groove.  We discussed options available to patient including physical therapy versus diagnostic/therapeutic glenohumeral joint injection under ultrasound versus MRI of the cervical spine for further evaluation of canal versus foraminal stenosis.,  He would like to hold off on any intervention and just see how his  symptoms will evolve.  Currently really not bothering him enough to do anything about it.  He will follow-up with the office as needed per his request.  Follow-Up Instructions: No follow-ups on file.   Orders:  Orders Placed This Encounter  Procedures   XR Shoulder Right   XR Shoulder Left   XR Cervical Spine 2 or 3 views   No orders of the defined types were placed in this encounter.     Procedures: No procedures performed   Clinical Data: No additional findings.  Objective: Vital Signs: There were no vitals taken for this visit.  Physical Exam:  Constitutional: Patient appears well-developed HEENT:  Head: Normocephalic Eyes:EOM are normal Neck: Normal range of motion Cardiovascular: Normal rate Pulmonary/chest: Effort  normal Neurologic: Patient is alert Skin: Skin is warm Psychiatric: Patient has normal mood and affect  Ortho Exam: Ortho exam demonstrates right shoulder with 80 degrees external rotation, 90 degrees abduction, 170 degrees forward elevation passively.  This compared with the left shoulder with 80 degrees external rotation, 90 degrees abduction, 175 degrees forward elevation passively.  Active range of motion of the shoulder is equivalent to passive range of motion.  He has no crepitus noted with passive motion of either shoulder.  No deformity or cellulitis noted to either shoulder.  He has excellent strength of grip strength, EPL, pronation/supination, bicep, tricep, deltoid with no reproduction of pain.  Axillary nerve is intact with deltoid firing bilaterally.  He has 2+ radial pulse of each extremity.  He does have moderate tenderness over the bicipital groove in either shoulder and has positive O'Brien sign with reproduction of his anterior pain.  Negative Neer impingement sign bilaterally.  Negative Hawkins impingement sign bilaterally.  He has negative external rotation lag sign and negative Hornblower sign bilaterally.  Does have negative Spurling sign and negative Lhermitte sign but has stiffness of his neck that is noted compared with the relative free range of motion of his bilateral shoulders.  Excellent rotator cuff strength of supra, infra, subscap bilaterally rated 5/5.  Specialty Comments:  No specialty comments available.  Imaging: No results found.   PMFS History: Patient Active Problem List   Diagnosis Date Noted   Tennis elbow 07/14/2020   Impingement syndrome of left shoulder 01/27/2020   Olecranon bursitis, right elbow 09/24/2017   Gout 09/19/2016   Essential hypertension 09/19/2016   Paresthesia 09/19/2016   GERD (gastroesophageal reflux disease) 09/19/2016   Past Medical History:  Diagnosis Date   Abnormal EKG    Arthritis    Benign hypertensive heart disease  without HF (heart failure)    Cataract    bil cataracts   Colon polyps    GERD (gastroesophageal reflux disease)    Gout    Hyperlipemia    Hypertension    Kidney stones    Neuromuscular disorder (HCC)    neuropathy   Palpitations     Family History  Problem Relation Age of Onset   Hypertension Mother    Diabetes Mother    Hyperlipidemia Mother    Hypertension Father    Hypertension Sister    Hyperlipidemia Sister    Heart disease Sister    Stroke Brother    Seizures Brother    Colon cancer Neg Hx    Stomach cancer Neg Hx    Esophageal cancer Neg Hx    Rectal cancer Neg Hx     Past Surgical History:  Procedure Laterality Date   ELBOW BURSA SURGERY Right 09/2017   Social  History   Occupational History   Occupation: Retired  Tobacco Use   Smoking status: Never   Smokeless tobacco: Never  Vaping Use   Vaping Use: Never used  Substance and Sexual Activity   Alcohol use: Yes    Comment: Rarely   Drug use: No   Sexual activity: Not on file

## 2023-02-05 ENCOUNTER — Telehealth: Payer: Self-pay | Admitting: Internal Medicine

## 2023-02-05 NOTE — Telephone Encounter (Signed)
Called patient per his request. States he stopped Rosuvastatin (5mg  daily) three days ago. Per chart he went to orthopedic's office on 02/02/23 and was cleared for anything mechanically wrong. He states BP and pulse have all been fine and pain "is a little better today than it was, but still not gone." He is going to remain off the statin for 2 weeks and call us back with an update. He did confirm that he is still taking the Zetia daily.

## 2023-02-05 NOTE — Telephone Encounter (Signed)
Patient is requesting to speak with RN Judson Roch

## 2023-02-05 NOTE — Telephone Encounter (Signed)
Returned call to ensure patient didn't have any other questions-states no. Will close encounter

## 2023-02-21 NOTE — Telephone Encounter (Signed)
Okay for xray prior to appointment if he can get xray only visit

## 2023-02-22 ENCOUNTER — Ambulatory Visit (INDEPENDENT_AMBULATORY_CARE_PROVIDER_SITE_OTHER): Payer: Medicare Other | Admitting: Physician Assistant

## 2023-02-22 ENCOUNTER — Other Ambulatory Visit (INDEPENDENT_AMBULATORY_CARE_PROVIDER_SITE_OTHER): Payer: Medicare Other

## 2023-02-22 ENCOUNTER — Encounter: Payer: Self-pay | Admitting: Physician Assistant

## 2023-02-22 DIAGNOSIS — M25551 Pain in right hip: Secondary | ICD-10-CM | POA: Diagnosis not present

## 2023-02-22 NOTE — Progress Notes (Signed)
Office Visit Note   Patient: Scott Graves           Date of Birth: [birth year]           MRN: [id number] Visit Date: 02/22/2023              Requested by: Renaye Rakers, MD [address] [place],  [place] [address] PCP: Renaye Rakers, MD  Chief Complaint  Patient presents with   Right Hip - Pain      HPI: Patient is a pleasant 72 year old gentleman who comes in with a 1 week history of right hip pain.  He said he was stumbled and was to fall to the left onto a piece of glass and tried to counterbalance onto his right hip.  He did not actually fall.  He did not feel any pop.  He states it hurts to walk and lie down.  He describes it as an aching pain.  He was taking Tylenol but did not feel it was helping.  He describes his pain as moderate  Assessment & Plan: Visit Diagnoses:  1. Pain of right hip     Plan: Right hip pain likely musculature in origin.  Had more focal tenderness both posteriorly and anteriorly to greater trochanter.  Had some mild pain with compression.  Had fairly good hip mobility.  No radicular findings and his strength is good.  Does not have any ecchymosis.  His x-ray did show some calcification in the abductors.  I did review these x-rays with Dr. Roda Shutters he did not see anything concerning.  Unfortunately the patient cannot take anti-inflammatories.  He might do well with some physical therapy but he does not want to do this.  Talked about topical medication he could use.  He will come back in 2 weeks.  Could consider a trochanteric bursa injection if the pain is more localized.  I do not think this is coming from his back.  I do not think this would warrant pain medication and neither does he.  Of course if he gets worse he will contact me immediately  Follow-Up Instructions: No follow-ups on file.   Ortho Exam  Patient is alert, oriented, no adenopathy, well-dressed, normal affect, normal respiratory effort. Examination he is a fit man pleasant to exam.  He has  good mobility with hip rotation without much pain in the groin.  He is tender in the posterior buttock on the right there is no radicular findings and a little bit anterior to the trochanteric bursa.  Not as tender over the trochanteric bursa he has good flexion and extension straight negative straight leg raise he has 5 out of 5 strength with resisted dorsiflexion and plantarflexion of his ankles extension and flexion of his legs neurovascular intact  Imaging: No results found. No images are attached to the encounter.  Labs: Lab Results  Component Value Date   HGBA1C 5.9 (H) 01/02/2018   REPTSTATUS 11/13/2020 FINAL 11/11/2020   CULT  11/11/2020    NO GROUP A STREP (S.PYOGENES) ISOLATED Performed at [facility], [address] [place], [place] [address]      Lab Results  Component Value Date   ALBUMIN 4.8 08/15/2018   ALBUMIN 4.9 (H) 01/11/2018    No results found for: "MG" Lab Results  Component Value Date   VD25OH 16.2 (L) 01/02/2018    No results found for: "PREALBUMIN"    Latest Ref Rng & Units 07/28/2021    4:08 AM  08/15/2018   11:58 AM 04/02/2018    8:53 PM  CBC EXTENDED  WBC 4.0 - 10.5 K/uL 5.9  4.5  5.8   RBC 4.22 - 5.81 MIL/uL 4.40  5.04  4.60   Hemoglobin 13.0 - 17.0 g/dL 37.2  90.2  11.1   HCT 39.0 - 52.0 % 36.5  41.0  37.9   Platelets 150 - 400 K/uL 199  250.0  247   NEUT# 1.7 - 7.7 K/uL 2.0  2.3    Lymph# 0.7 - 4.0 K/uL 3.0  1.6       There is no height or weight on file to calculate BMI.  Orders:  Orders Placed This Encounter  Procedures   XR HIP UNILAT W OR W/O PELVIS 2-3 VIEWS RIGHT   No orders of the defined types were placed in this encounter.    Procedures: No procedures performed  Clinical Data: No additional findings.  ROS:  All other systems negative, except as noted in the HPI. Review of Systems  Objective: Vital Signs: There were no vitals taken for this visit.  Specialty Comments:  No specialty comments  available.  PMFS History: Patient Active Problem List   Diagnosis Date Noted   Tennis elbow 07/14/2020   Impingement syndrome of left shoulder 01/27/2020   Olecranon bursitis, right elbow 09/24/2017   Gout 09/19/2016   Essential hypertension 09/19/2016   Paresthesia 09/19/2016   GERD (gastroesophageal reflux disease) 09/19/2016   Past Medical History:  Diagnosis Date   Abnormal EKG    Arthritis    Benign hypertensive heart disease without HF (heart failure)    Cataract    bil cataracts   Colon polyps    GERD (gastroesophageal reflux disease)    Gout    Hyperlipemia    Hypertension    Kidney stones    Neuromuscular disorder    neuropathy   Palpitations     Family History  Problem Relation Age of Onset   Hypertension Mother    Diabetes Mother    Hyperlipidemia Mother    Hypertension Father    Hypertension Sister    Hyperlipidemia Sister    Heart disease Sister    Stroke Brother    Seizures Brother    Colon cancer Neg Hx    Stomach cancer Neg Hx    Esophageal cancer Neg Hx    Rectal cancer Neg Hx     Past Surgical History:  Procedure Laterality Date   ELBOW BURSA SURGERY Right 09/2017   Social History   Occupational History   Occupation: Retired  Tobacco Use   Smoking status: Never   Smokeless tobacco: Never  Vaping Use   Vaping Use: Never used  Substance and Sexual Activity   Alcohol use: Yes    Comment: Rarely   Drug use: No   Sexual activity: Not on file

## 2023-02-28 ENCOUNTER — Ambulatory Visit: Payer: Medicare Other | Admitting: Surgical

## 2023-03-01 DIAGNOSIS — E785 Hyperlipidemia, unspecified: Secondary | ICD-10-CM | POA: Diagnosis not present

## 2023-03-01 DIAGNOSIS — I10 Essential (primary) hypertension: Secondary | ICD-10-CM | POA: Diagnosis not present

## 2023-03-01 DIAGNOSIS — E1169 Type 2 diabetes mellitus with other specified complication: Secondary | ICD-10-CM | POA: Diagnosis not present

## 2023-03-06 DIAGNOSIS — E1169 Type 2 diabetes mellitus with other specified complication: Secondary | ICD-10-CM | POA: Diagnosis not present

## 2023-03-06 DIAGNOSIS — I10 Essential (primary) hypertension: Secondary | ICD-10-CM | POA: Diagnosis not present

## 2023-03-06 DIAGNOSIS — M67851 Other specified disorders of synovium, right hip: Secondary | ICD-10-CM | POA: Diagnosis not present

## 2023-03-06 DIAGNOSIS — N189 Chronic kidney disease, unspecified: Secondary | ICD-10-CM | POA: Diagnosis not present

## 2023-03-06 DIAGNOSIS — E785 Hyperlipidemia, unspecified: Secondary | ICD-10-CM | POA: Diagnosis not present

## 2023-03-14 DIAGNOSIS — M6281 Muscle weakness (generalized): Secondary | ICD-10-CM | POA: Diagnosis not present

## 2023-03-14 DIAGNOSIS — M25551 Pain in right hip: Secondary | ICD-10-CM | POA: Diagnosis not present

## 2023-03-14 DIAGNOSIS — M5459 Other low back pain: Secondary | ICD-10-CM | POA: Diagnosis not present

## 2023-03-20 DIAGNOSIS — M6281 Muscle weakness (generalized): Secondary | ICD-10-CM | POA: Diagnosis not present

## 2023-03-20 DIAGNOSIS — M25551 Pain in right hip: Secondary | ICD-10-CM | POA: Diagnosis not present

## 2023-03-20 DIAGNOSIS — M5459 Other low back pain: Secondary | ICD-10-CM | POA: Diagnosis not present

## 2023-03-22 DIAGNOSIS — M25551 Pain in right hip: Secondary | ICD-10-CM | POA: Diagnosis not present

## 2023-03-22 DIAGNOSIS — M5459 Other low back pain: Secondary | ICD-10-CM | POA: Diagnosis not present

## 2023-03-22 DIAGNOSIS — M6281 Muscle weakness (generalized): Secondary | ICD-10-CM | POA: Diagnosis not present

## 2023-03-30 DIAGNOSIS — M6281 Muscle weakness (generalized): Secondary | ICD-10-CM | POA: Diagnosis not present

## 2023-03-30 DIAGNOSIS — M5459 Other low back pain: Secondary | ICD-10-CM | POA: Diagnosis not present

## 2023-03-30 DIAGNOSIS — M25551 Pain in right hip: Secondary | ICD-10-CM | POA: Diagnosis not present

## 2023-04-06 IMAGING — US US RENAL
1 series · 14 of 25 positions shown · non-contrast
Comparison: Renal ultrasound 07/19/2018

CLINICAL DATA: Acute kidney injury.

EXAM:
RENAL / URINARY TRACT ULTRASOUND COMPLETE

[Series 1: us renal · 0.23mm/px · 14 of 39 slices shown]
[im 1/39]
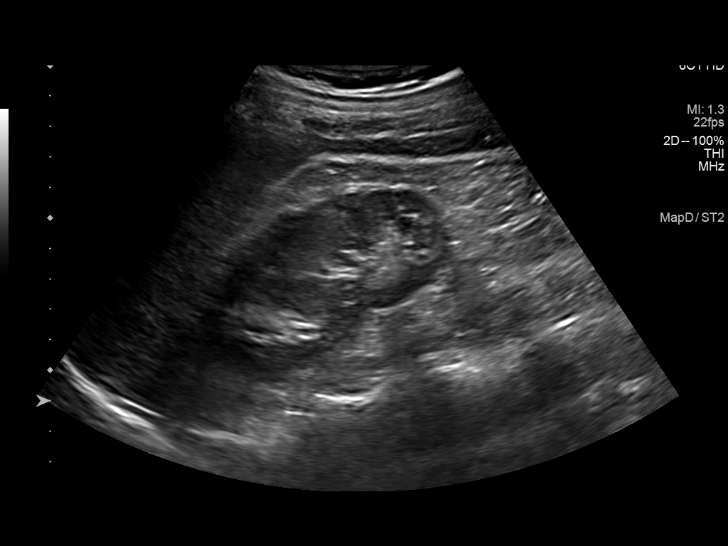
[im 4/39]
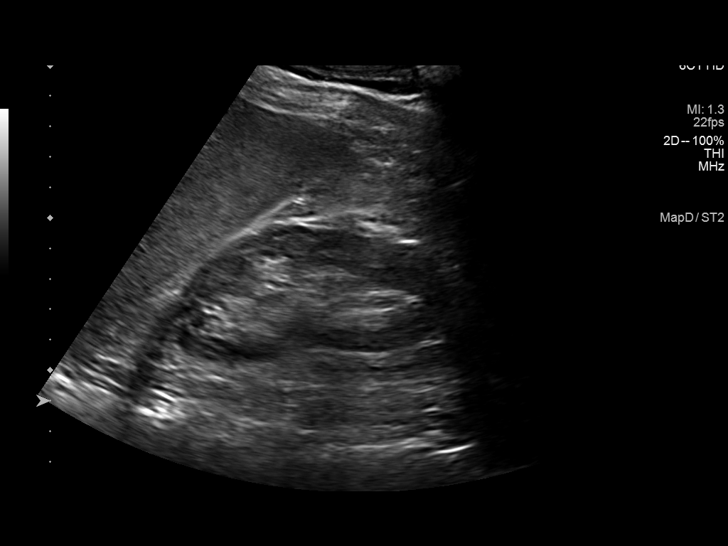
[im 7/39]
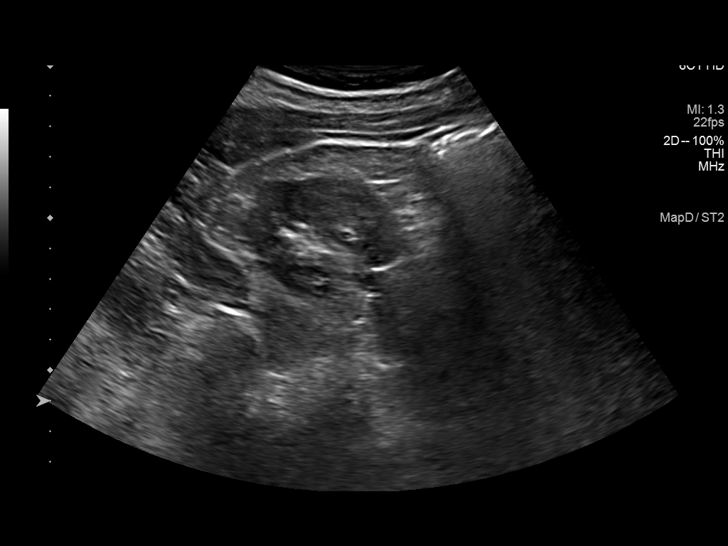
[im 10/39]
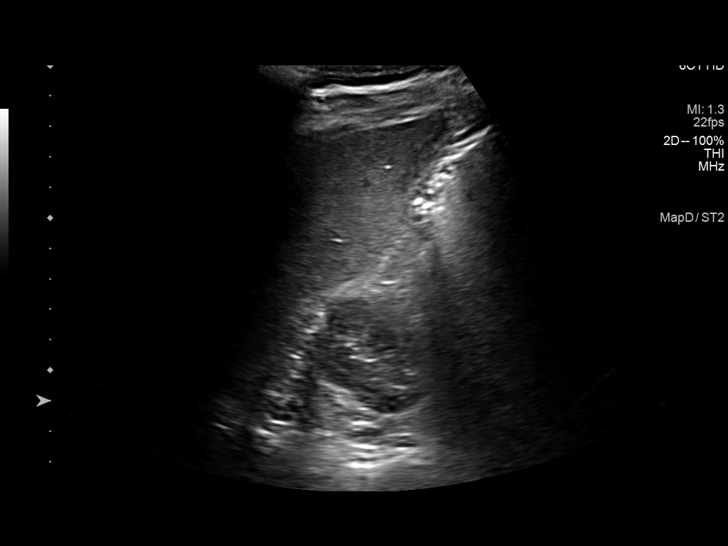
[im 13/39]
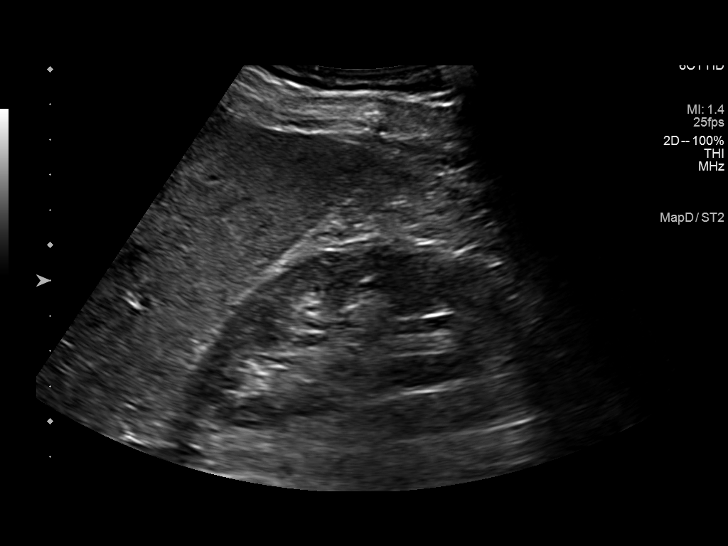
[im 15/39]
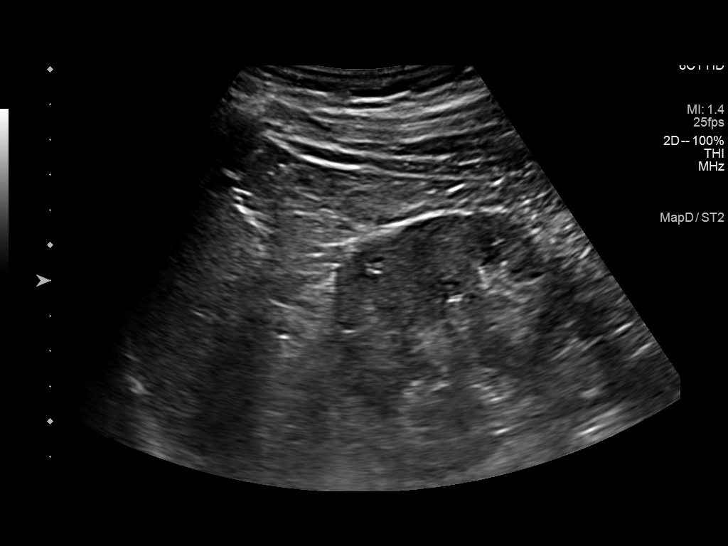
[im 18/39]
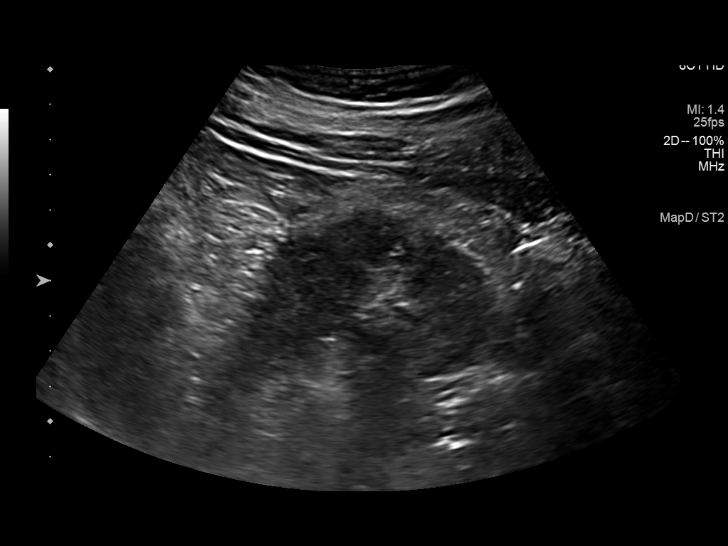
[im 21/39]
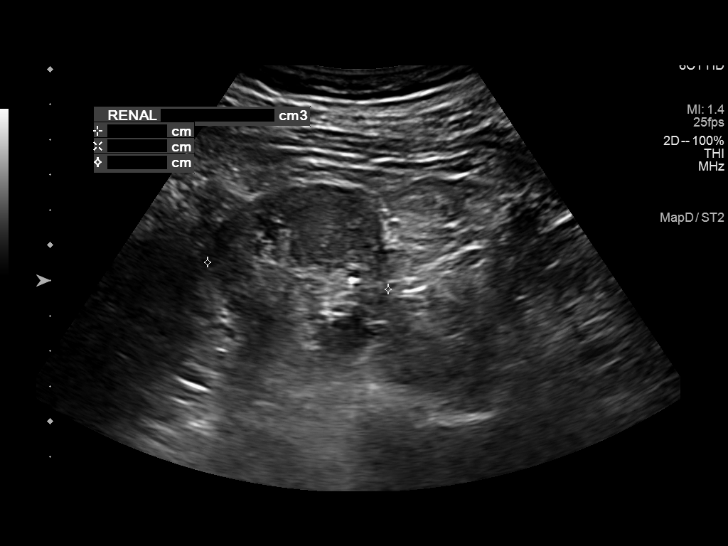
[im 24/39]
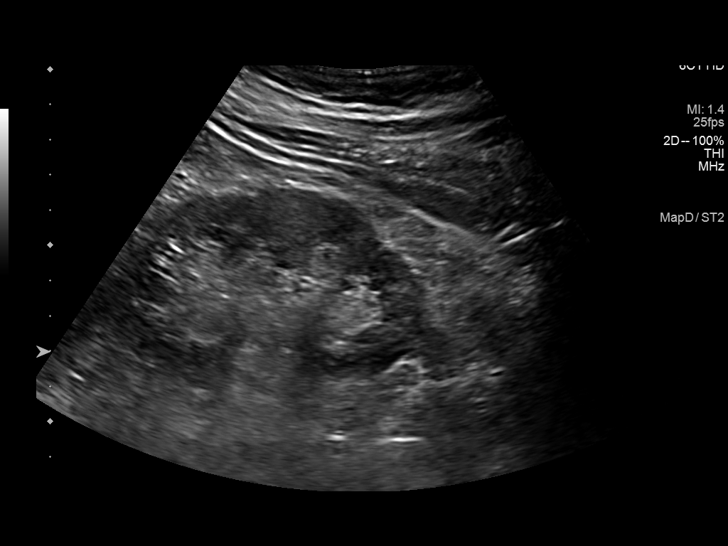
[im 26/39]
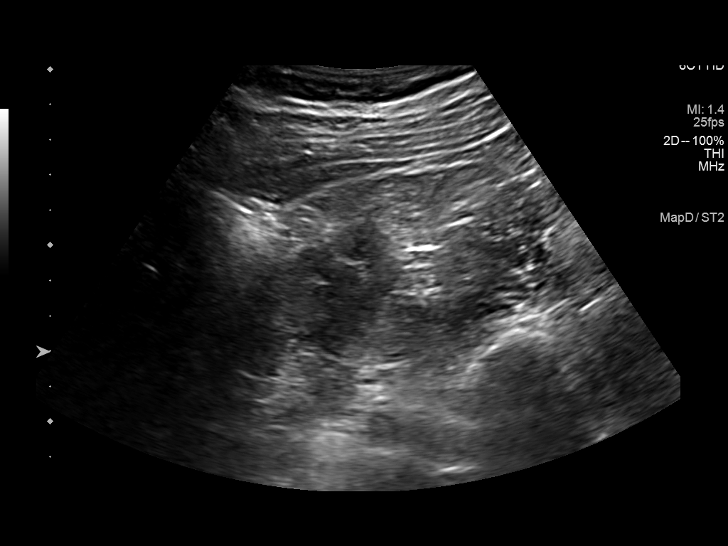
[im 29/39]
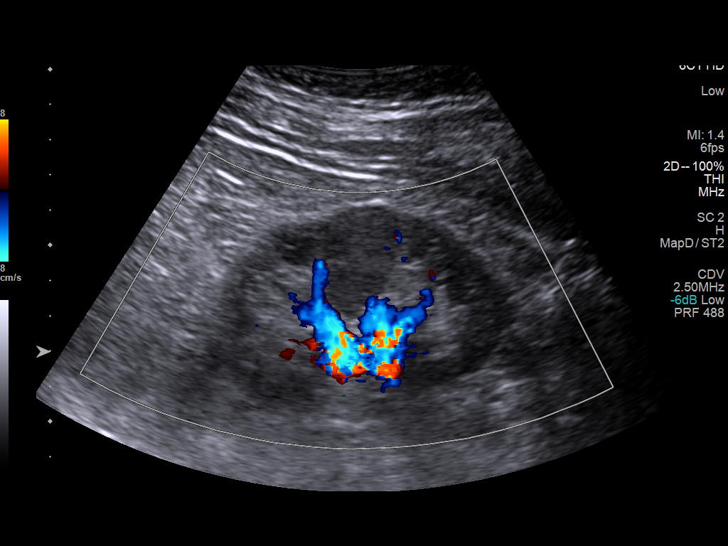
[im 32/39]
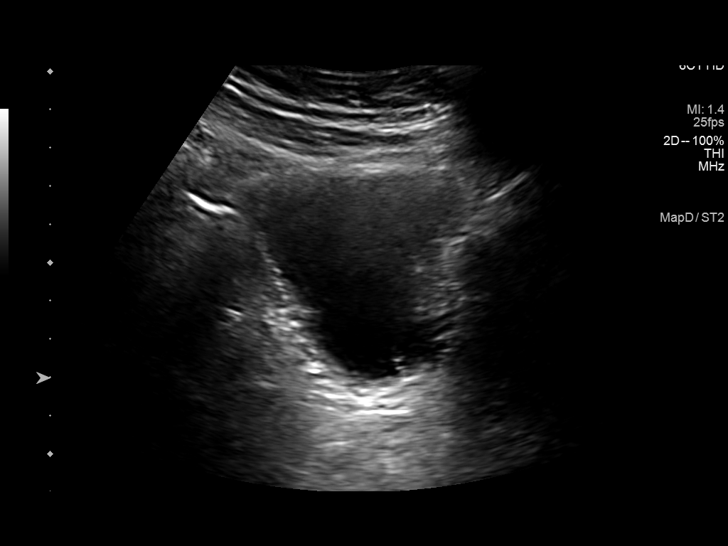
[im 35/39]
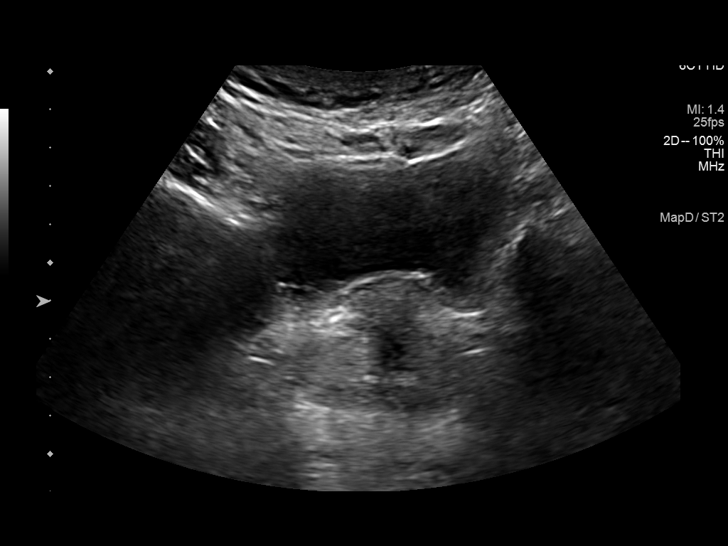
[im 39/39]
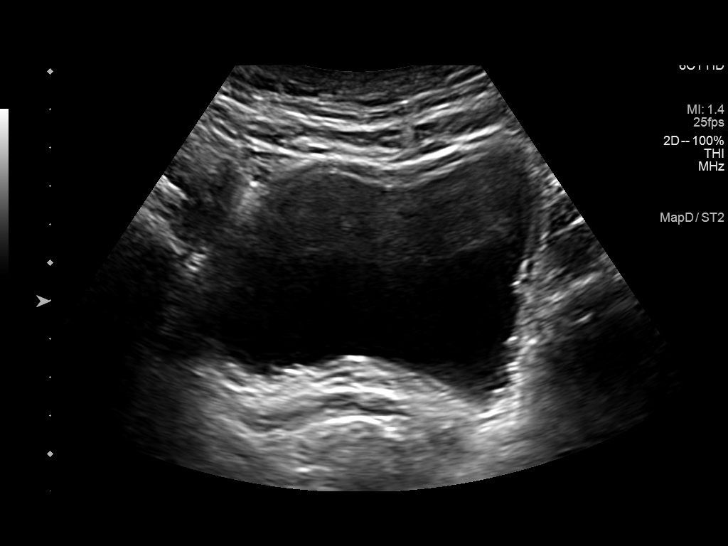

[14 of 25 positions shown; findings below may reference images not displayed]

FINDINGS: Right Kidney:

Renal measurements: 9.3 x 3.9 x 4.6 cm = volume: 87 mL. Normal
parenchymal echogenicity. No hydronephrosis. No visualized stone or
focal lesion. Previous renal stone is not seen on the current exam.

Left Kidney:

Renal measurements: 8.9 x 5.3 x 5.2 cm = volume: 128 mL. Normal
parenchymal echogenicity. No hydronephrosis. No visualized stone or
focal lesion. Previous renal stone is not seen on the current exam.

Bladder:

There is bladder wall thickening and questionable bladder
trabeculation. No focal bladder lesion.

Other:

None.
IMPRESSION: 1. Normal sonographic appearance of the kidneys. Previous renal
calculi are not seen on the current exam.
2. Bladder wall thickening with question of bladder trabeculation,
not seen on 4834 exam.

## 2023-04-17 DIAGNOSIS — E785 Hyperlipidemia, unspecified: Secondary | ICD-10-CM | POA: Diagnosis not present

## 2023-04-17 DIAGNOSIS — E1169 Type 2 diabetes mellitus with other specified complication: Secondary | ICD-10-CM | POA: Diagnosis not present

## 2023-04-17 DIAGNOSIS — I10 Essential (primary) hypertension: Secondary | ICD-10-CM | POA: Diagnosis not present

## 2023-04-17 DIAGNOSIS — N182 Chronic kidney disease, stage 2 (mild): Secondary | ICD-10-CM | POA: Diagnosis not present

## 2023-04-17 DIAGNOSIS — M67851 Other specified disorders of synovium, right hip: Secondary | ICD-10-CM | POA: Diagnosis not present

## 2023-04-20 DIAGNOSIS — N401 Enlarged prostate with lower urinary tract symptoms: Secondary | ICD-10-CM | POA: Diagnosis not present

## 2023-04-27 DIAGNOSIS — N401 Enlarged prostate with lower urinary tract symptoms: Secondary | ICD-10-CM | POA: Diagnosis not present

## 2023-04-27 DIAGNOSIS — R3914 Feeling of incomplete bladder emptying: Secondary | ICD-10-CM | POA: Diagnosis not present

## 2023-04-27 DIAGNOSIS — R3912 Poor urinary stream: Secondary | ICD-10-CM | POA: Diagnosis not present

## 2023-06-19 ENCOUNTER — Ambulatory Visit: Payer: Medicare Other | Admitting: Internal Medicine

## 2023-06-20 NOTE — Progress Notes (Signed)
Cardiology Office Note:   Date:  06/27/2023  ID:  Scott Graves, DOB 03-13-1951, MRN [id number] PCP:  Renaye Rakers, MD  [facility] Providers Cardiologist:  Alverda Skeans, MD Referring MD: Renaye Rakers, MD   Chief Complaint/Reason for Referral: Cardiology follow-up ASSESSMENT:    1. Essential hypertension   2. Hyperlipidemia LDL goal <100   3. CKD (chronic kidney disease) stage 2, GFR 60-89 ml/min     PLAN:   In order of problems listed above: 1.  Hypertension: Blood pressure is well-controlled today. 2.  Hyperlipidemia: This is being followed by the patient's primary care provider. 3.  Chronic kidney disease: Continue Micardis/HCT.             Dispo:  Return in about 9 months (around 03/26/2024).      Medication Adjustments/Labs and Tests Ordered: Current medicines are reviewed at length with the patient today.  Concerns regarding medicines are outlined above.  The following changes have been made:  No change  Labs/tests ordered: No orders of the defined types were placed in this encounter.   Medication Changes: No orders of the defined types were placed in this encounter.   Current medicines are reviewed at length with the patient today.  The patient does not have concerns regarding medicines.  History of Present Illness:   FOCUSED PROBLEM LIST:   1.  Hypertension 2.  Hyperlipidemia 3.  CKD stage II   July 2023: The patient is a 72 y.o. male with the indicated medical history here for recommendations regarding blood pressure management.  The patient requested to be seen regarding blood pressure management issue.  He has not been seen here for several years.  The patient was seen recently by his primary care provider.  They noted a blood pressure of 80/60 mmHg.  No blood pressure medication changes were made.  The patient was then seen by his nephrologist.  His Micardis was discontinued and it was suggested that he start telmisartan 20 mg.  He had been on  Micardis 40 x 12.5 mg.  The patient was very concerned about all of this and did not start the medication.  He has not been exercising as he did before.  He wanted to be seen today for further recommendations.  He denies any chest pain, palpitations, paroxysmal nocturnal dyspnea, orthopnea.  He has required no emergency room visits or hospitalizations otherwise.  Plan.  Start Micardis 20 mg daily.  Today: In the interim he was seen our office in February for blood pressure check.  His Micardis was eventually increased to 40 mg with improved control.  Today his blood pressure is well-controlled.  The patient is doing very well.  He tells me that his blood pressures at home are usually in the 120s range.  He is very happy with this.  He is having some issues with neuropathy and he will be following up with neurology about this.  He is otherwise well from a cardiovascular standpoint.       Previous Medical History: Past Medical History:  Diagnosis Date   Abnormal EKG    Arthritis    Benign hypertensive heart disease without HF (heart failure)    Cataract    bil cataracts   Colon polyps    GERD (gastroesophageal reflux disease)    Gout    Hyperlipemia    Hypertension    Kidney stones    Neuromuscular disorder (HCC)    neuropathy   Palpitations      Current Medications: Current  Meds  Medication Sig   allopurinol (ZYLOPRIM) 100 MG tablet Take 1 tablet by mouth daily.   MICARDIS HCT 40-12.5 MG tablet Take 1 tablet by mouth daily.   pantoprazole (PROTONIX) 40 MG tablet Take 1 tablet (40 mg total) by mouth daily before breakfast.   potassium chloride (KLOR-CON M) 10 MEQ tablet Take 10 mEq by mouth as needed.   Vitamin D, Ergocalciferol, (DRISDOL) 1.25 MG (50000 UNIT) CAPS capsule Take by mouth every 7 (seven) days.   ZETIA 10 MG tablet Take 10 mg by mouth daily.     Allergies:    Statins   Social History:   Social History   Tobacco Use   Smoking status: Never   Smokeless tobacco:  Never  Vaping Use   Vaping status: Never Used  Substance Use Topics   Alcohol use: Yes    Comment: Rarely   Drug use: No     Family Hx: Family History  Problem Relation Age of Onset   Hypertension Mother    Diabetes Mother    Hyperlipidemia Mother    Hypertension Father    Hypertension Sister    Hyperlipidemia Sister    Heart disease Sister    Stroke Brother    Seizures Brother    Colon cancer Neg Hx    Stomach cancer Neg Hx    Esophageal cancer Neg Hx    Rectal cancer Neg Hx      Review of Systems:   Please see the history of present illness.    All other systems reviewed and are negative.     EKGs/Labs/Other Test Reviewed:   EKG: EKG from February 2024 that I personally reviewed demonstrates sinus rhythm with possible LVH and occasional PVCs  EKG Interpretation Date/Time:    Ventricular Rate:    PR Interval:    QRS Duration:    QT Interval:    QTC Calculation:   R Axis:      Text Interpretation:          Prior CV studies reviewed: Cardiac Studies & Procedures         MONITORS  CARDIAC EVENT MONITOR 04/25/2018           Other studies Reviewed: No imaging studies demonstrating aortic atherosclerosis or coronary artery calcification  Recent Labs: 08/11/2022: BUN 16; Creatinine, Ser 1.24; Potassium 4.2; Sodium 143   Lipid Panel    Component Value Date/Time   CHOL 187 06/07/2016 0000   TRIG 129 06/07/2016 0000   HDL 53 06/07/2016 0000   LDLCALC 108 06/07/2016 0000    Risk Assessment/Calculations:          Physical Exam:   VS:  BP 112/78 (BP Location: Left Arm, Patient Position: Sitting, Cuff Size: Normal)   Pulse 89   Ht 5\' 8"  (1.727 m)   Wt 166 lb (75.3 kg)   SpO2 99%   BMI 25.24 kg/m        Wt Readings from Last 3 Encounters:  06/27/23 166 lb (75.3 kg)  12/25/22 168 lb 9.6 oz (76.5 kg)  10/24/22 164 lb (74.4 kg)      GENERAL:  No apparent distress, AOx3 HEENT:  No carotid bruits, +2 carotid impulses, no scleral icterus CAR:  RRR no murmurs, gallops, rubs, or thrills RES:  Clear to auscultation bilaterally ABD:  Soft, nontender, nondistended, positive bowel sounds x 4 VASC:  +2 radial pulses, +2 carotid pulses NEURO:  CN 2-12 grossly intact; motor and sensory grossly intact PSYCH:  No active depression  or anxiety EXT:  No edema, ecchymosis, or cyanosis  Signed, Orbie Pyo, MD  06/27/2023 11:44 AM    [facility] [address] [place], [place], [place]  [address] Phone: [phone]; Fax: [phone]   Note:  This document was prepared using Dragon voice recognition software and may include unintentional dictation errors.

## 2023-06-27 ENCOUNTER — Ambulatory Visit: Payer: Medicare Other | Attending: Internal Medicine | Admitting: Internal Medicine

## 2023-06-27 ENCOUNTER — Encounter: Payer: Self-pay | Admitting: Internal Medicine

## 2023-06-27 VITALS — BP 112/78 | HR 89 | Ht 68.0 in | Wt 166.0 lb

## 2023-06-27 DIAGNOSIS — I10 Essential (primary) hypertension: Secondary | ICD-10-CM

## 2023-06-27 DIAGNOSIS — E785 Hyperlipidemia, unspecified: Secondary | ICD-10-CM | POA: Diagnosis not present

## 2023-06-27 DIAGNOSIS — N182 Chronic kidney disease, stage 2 (mild): Secondary | ICD-10-CM | POA: Diagnosis not present

## 2023-06-27 NOTE — Patient Instructions (Signed)
Medication Instructions:  Your physician recommends that you continue on your current medications as directed. Please refer to the Current Medication list given to you today.  *If you need a refill on your cardiac medications before your next appointment, please call your pharmacy*  Lab Work: If you have labs (blood work) drawn today and your tests are completely normal, you will receive your results only by: MyChart Message (if you have MyChart) OR A paper copy in the mail If you have any lab test that is abnormal or we need to change your treatment, we will call you to review the results.  Follow-Up: At [facility], you and your health needs are our priority.  As part of our continuing mission to provide you with exceptional heart care, we have created designated Provider Care Teams.  These Care Teams include your primary Cardiologist (physician) and Advanced Practice Providers (APPs -  Physician Assistants and Nurse Practitioners) who all work together to provide you with the care you need, when you need it.  We recommend signing up for the patient portal called "MyChart".  Sign up information is provided on this After Visit Summary.  MyChart is used to connect with patients for Virtual Visits (Telemedicine).  Patients are able to view lab/test results, encounter notes, upcoming appointments, etc.  Non-urgent messages can be sent to your provider as well.   To learn more about what you can do with MyChart, go to [email or web address].    Your next appointment:   9 month(s)  Provider:   Jari Favre, PA-C, Ronie Spies, PA-C, Robin Searing, NP, Jacolyn Reedy, PA-C, Eligha Bridegroom, NP, Tereso Newcomer, PA-C, or Perlie Gold, PA-C

## 2023-07-16 ENCOUNTER — Ambulatory Visit
Admission: RE | Admit: 2023-07-16 | Discharge: 2023-07-16 | Disposition: A | Discharge status: Home / Self Care | Payer: Medicare Other | Source: Ambulatory Visit | Attending: Internal Medicine | Admitting: Internal Medicine

## 2023-07-16 VITALS — BP 102/66 | HR 110 | Temp 98.9°F | Resp 18

## 2023-07-16 DIAGNOSIS — R509 Fever, unspecified: Secondary | ICD-10-CM | POA: Diagnosis present

## 2023-07-16 DIAGNOSIS — U071 COVID-19: Secondary | ICD-10-CM | POA: Insufficient documentation

## 2023-07-16 DIAGNOSIS — J069 Acute upper respiratory infection, unspecified: Secondary | ICD-10-CM

## 2023-07-16 MED ORDER — FLUTICASONE PROPIONATE 50 MCG/ACT NA SUSP: 1.0000 | Freq: Every day | NASAL | 0 refills | Status: AC

## 2023-07-16 NOTE — ED Provider Notes (Signed)
[facility]    CSN: [id number] Arrival date & time: 07/16/23  0804      History   Chief Complaint Chief Complaint  Patient presents with   Nasal Congestion    Elevated temperature. - Entered by patient    HPI Thaddeous Lorino is a 72 y.o. male.   Patient presents with nasal congestion and fever that started last night.  Tmax at home was 100.  Denies any obvious known sick contacts but reports that he was recently traveling on an airplane from [place].  Has taken Robitussin and tylenol for symptoms.  Denies history of asthma or COPD and patient does not smoke cigarettes.  Denies chest pain or shortness of breath.     Past Medical History:  Diagnosis Date   Abnormal EKG    Arthritis    Benign hypertensive heart disease without HF (heart failure)    Cataract    bil cataracts   Colon polyps    GERD (gastroesophageal reflux disease)    Gout    Hyperlipemia    Hypertension    Kidney stones    Neuromuscular disorder (HCC)    neuropathy   Palpitations     Patient Active Problem List   Diagnosis Date Noted   Tennis elbow 07/14/2020   Impingement syndrome of left shoulder 01/27/2020   Olecranon bursitis, right elbow 09/24/2017   Gout 09/19/2016   Essential hypertension 09/19/2016   Paresthesia 09/19/2016   GERD (gastroesophageal reflux disease) 09/19/2016    Past Surgical History:  Procedure Laterality Date   ELBOW BURSA SURGERY Right 09/2017       Home Medications    Prior to Admission medications   Medication Sig Start Date End Date Taking? Authorizing Provider  allopurinol (ZYLOPRIM) 100 MG tablet Take 1 tablet by mouth daily. 09/30/18  Yes [provider]  fluticasone (FLONASE) 50 MCG/ACT nasal spray Place 1 spray into both nostrils daily. 07/16/23  Yes Ellysa Parrack, Murielle Stang E, FNP  MICARDIS HCT 40-12.5 MG tablet Take 1 tablet by mouth daily. 11/03/22  Yes [provider]  pantoprazole (PROTONIX) 40 MG tablet Take 1 tablet (40 mg total) by  mouth daily before breakfast. 01/01/23  Yes Lemmon, Violet Baldy, PA  Vitamin D, Ergocalciferol, (DRISDOL) 1.25 MG (50000 UNIT) CAPS capsule Take by mouth every 7 (seven) days.   Yes [provider]  ZETIA 10 MG tablet Take 10 mg by mouth daily. 05/05/16  Yes [provider]  potassium chloride (KLOR-CON M) 10 MEQ tablet Take 10 mEq by mouth as needed. 11/19/20   [provider]    Family History Family History  Problem Relation Age of Onset   Hypertension Mother    Diabetes Mother    Hyperlipidemia Mother    Hypertension Father    Hypertension Sister    Hyperlipidemia Sister    Heart disease Sister    Stroke Brother    Seizures Brother    Colon cancer Neg Hx    Stomach cancer Neg Hx    Esophageal cancer Neg Hx    Rectal cancer Neg Hx     Social History Social History   Tobacco Use   Smoking status: Never   Smokeless tobacco: Never  Vaping Use   Vaping status: Never Used  Substance Use Topics   Alcohol use: Yes    Comment: Rarely   Drug use: No     Allergies   Statins   Review of Systems Review of Systems Per HPI  Physical Exam Triage Vital  Signs ED Triage Vitals [07/16/23 0816]  Encounter Vitals Group     BP 102/66     Systolic BP Percentile      Diastolic BP Percentile      Pulse Rate (!) 110     Resp 18     Temp 98.9 F (37.2 C)     Temp Source Oral     SpO2 94 %     Weight      Height      Head Circumference      Peak Flow      Pain Score      Pain Loc      Pain Education      Exclude from Growth Chart    No data found.  Updated Vital Signs BP 102/66 (BP Location: Left Arm)   Pulse (!) 110   Temp 98.9 F (37.2 C) (Oral)   Resp 18   SpO2 97%   Visual Acuity Right Eye Distance:   Left Eye Distance:   Bilateral Distance:    Right Eye Near:   Left Eye Near:    Bilateral Near:     Physical Exam Constitutional:      General: He is not in acute distress.    Appearance: Normal appearance. He is not  toxic-appearing or diaphoretic.  HENT:     Head: Normocephalic and atraumatic.     Right Ear: Tympanic membrane and ear canal normal.     Left Ear: Tympanic membrane and ear canal normal.     Nose: Congestion present.     Mouth/Throat:     Mouth: Mucous membranes are moist.     Pharynx: No posterior oropharyngeal erythema.  Eyes:     Extraocular Movements: Extraocular movements intact.     Conjunctiva/sclera: Conjunctivae normal.     Pupils: Pupils are equal, round, and reactive to light.  Cardiovascular:     Rate and Rhythm: Normal rate and regular rhythm.     Pulses: Normal pulses.     Heart sounds: Normal heart sounds.  Pulmonary:     Effort: Pulmonary effort is normal. No respiratory distress.     Breath sounds: Normal breath sounds. No stridor. No wheezing, rhonchi or rales.  Abdominal:     General: Abdomen is flat. Bowel sounds are normal.     Palpations: Abdomen is soft.  Musculoskeletal:        General: Normal range of motion.     Cervical back: Normal range of motion.  Skin:    General: Skin is warm and dry.  Neurological:     General: No focal deficit present.     Mental Status: He is alert and oriented to person, place, and time. Mental status is at baseline.  Psychiatric:        Mood and Affect: Mood normal.        Behavior: Behavior normal.      UC Treatments / Results  Labs (all labs ordered are listed, but only abnormal results are displayed) Labs Reviewed  SARS CORONAVIRUS 2 (TAT 6-24 HRS)    EKG   Radiology No results found.  Procedures Procedures (including critical care time)  Medications Ordered in UC Medications - No data to display  Initial Impression / Assessment and Plan / UC Course  I have reviewed the triage vital signs and the nursing notes.  Pertinent labs & imaging results that were available during my care of the patient were reviewed by me and considered in my medical decision making (  see chart for details).     Patient  presents with symptoms likely from a viral upper respiratory infection.  Do not suspect underlying cardiopulmonary process. Symptoms seem unlikely related to ACS, CHF or COPD exacerbations, pneumonia, pneumothorax. Patient is nontoxic appearing and not in need of emergent medical intervention.  COVID test pending.  Heart rate mildly elevated on triage but appears normal on physical exam.  Recommended symptom control with medications and supportive care.  Patient was sent Flonase as he denies that he currently uses any daily nasal sprays.  Return if symptoms fail to improve in 1-2 weeks or you develop shortness of breath, chest pain, severe headache. Patient states understanding and is agreeable.  Discharged with PCP followup.  Final Clinical Impressions(s) / UC Diagnoses   Final diagnoses:  Viral upper respiratory infection     Discharge Instructions      It appears that you have a viral illness that should run its course.  Flonase has been prescribed to help alleviate nasal congestion.  COVID test is pending.  Will call if it is positive.  Please follow-up with urgent care or primary care if symptoms persist or worsen.    ED Prescriptions     Medication Sig Dispense Auth. Provider   fluticasone (FLONASE) 50 MCG/ACT nasal spray Place 1 spray into both nostrils daily. 16 g Gustavus Bryant, [place]      PDMP not reviewed this encounter.   Gustavus Bryant, [place] 07/16/23 [phone]

## 2023-07-16 NOTE — Discharge Instructions (Signed)
It appears that you have a viral illness that should run its course.  Flonase has been prescribed to help alleviate nasal congestion.  COVID test is pending.  Will call if it is positive.  Please follow-up with urgent care or primary care if symptoms persist or worsen.

## 2023-07-16 NOTE — ED Triage Notes (Signed)
Pt presents to office for nasal congestion and fever last night. Pt took Robitussin last night.

## 2023-07-17 LAB — SARS CORONAVIRUS 2 (TAT 6-24 HRS): SARS Coronavirus 2: POSITIVE — AB

## 2023-08-02 DIAGNOSIS — L820 Inflamed seborrheic keratosis: Secondary | ICD-10-CM | POA: Diagnosis not present

## 2023-08-09 DIAGNOSIS — M109 Gout, unspecified: Secondary | ICD-10-CM | POA: Diagnosis not present

## 2023-08-09 DIAGNOSIS — D649 Anemia, unspecified: Secondary | ICD-10-CM | POA: Diagnosis not present

## 2023-08-09 DIAGNOSIS — I129 Hypertensive chronic kidney disease with stage 1 through stage 4 chronic kidney disease, or unspecified chronic kidney disease: Secondary | ICD-10-CM | POA: Diagnosis not present

## 2023-08-09 DIAGNOSIS — Q615 Medullary cystic kidney: Secondary | ICD-10-CM | POA: Diagnosis not present

## 2023-08-09 DIAGNOSIS — N182 Chronic kidney disease, stage 2 (mild): Secondary | ICD-10-CM | POA: Diagnosis not present

## 2023-08-15 DIAGNOSIS — E1169 Type 2 diabetes mellitus with other specified complication: Secondary | ICD-10-CM | POA: Diagnosis not present

## 2023-08-15 DIAGNOSIS — N182 Chronic kidney disease, stage 2 (mild): Secondary | ICD-10-CM | POA: Diagnosis not present

## 2023-08-15 DIAGNOSIS — I10 Essential (primary) hypertension: Secondary | ICD-10-CM | POA: Diagnosis not present

## 2023-08-15 DIAGNOSIS — E785 Hyperlipidemia, unspecified: Secondary | ICD-10-CM | POA: Diagnosis not present

## 2023-08-15 DIAGNOSIS — E559 Vitamin D deficiency, unspecified: Secondary | ICD-10-CM | POA: Diagnosis not present

## 2023-08-17 DIAGNOSIS — Z0000 Encounter for general adult medical examination without abnormal findings: Secondary | ICD-10-CM | POA: Diagnosis not present

## 2023-08-17 DIAGNOSIS — I129 Hypertensive chronic kidney disease with stage 1 through stage 4 chronic kidney disease, or unspecified chronic kidney disease: Secondary | ICD-10-CM | POA: Diagnosis not present

## 2023-08-17 DIAGNOSIS — U099 Post covid-19 condition, unspecified: Secondary | ICD-10-CM | POA: Diagnosis not present

## 2023-08-17 DIAGNOSIS — E1122 Type 2 diabetes mellitus with diabetic chronic kidney disease: Secondary | ICD-10-CM | POA: Diagnosis not present

## 2023-08-17 DIAGNOSIS — E1169 Type 2 diabetes mellitus with other specified complication: Secondary | ICD-10-CM | POA: Diagnosis not present

## 2023-08-17 DIAGNOSIS — N182 Chronic kidney disease, stage 2 (mild): Secondary | ICD-10-CM | POA: Diagnosis not present

## 2023-08-17 DIAGNOSIS — D649 Anemia, unspecified: Secondary | ICD-10-CM | POA: Diagnosis not present

## 2023-08-17 DIAGNOSIS — E785 Hyperlipidemia, unspecified: Secondary | ICD-10-CM | POA: Diagnosis not present

## 2023-08-21 DIAGNOSIS — H2513 Age-related nuclear cataract, bilateral: Secondary | ICD-10-CM | POA: Diagnosis not present

## 2023-08-21 DIAGNOSIS — H40053 Ocular hypertension, bilateral: Secondary | ICD-10-CM | POA: Diagnosis not present

## 2023-08-23 DIAGNOSIS — Z23 Encounter for immunization: Secondary | ICD-10-CM | POA: Diagnosis not present

## 2023-11-21 DIAGNOSIS — Z23 Encounter for immunization: Secondary | ICD-10-CM | POA: Diagnosis not present

## 2023-11-26 ENCOUNTER — Other Ambulatory Visit: Payer: Self-pay | Admitting: Physician Assistant

## 2024-01-04 DIAGNOSIS — I129 Hypertensive chronic kidney disease with stage 1 through stage 4 chronic kidney disease, or unspecified chronic kidney disease: Secondary | ICD-10-CM | POA: Diagnosis not present

## 2024-01-04 DIAGNOSIS — D649 Anemia, unspecified: Secondary | ICD-10-CM | POA: Diagnosis not present

## 2024-01-04 DIAGNOSIS — E785 Hyperlipidemia, unspecified: Secondary | ICD-10-CM | POA: Diagnosis not present

## 2024-01-04 DIAGNOSIS — N182 Chronic kidney disease, stage 2 (mild): Secondary | ICD-10-CM | POA: Diagnosis not present

## 2024-01-04 DIAGNOSIS — E1169 Type 2 diabetes mellitus with other specified complication: Secondary | ICD-10-CM | POA: Diagnosis not present

## 2024-01-07 DIAGNOSIS — N182 Chronic kidney disease, stage 2 (mild): Secondary | ICD-10-CM | POA: Diagnosis not present

## 2024-01-07 DIAGNOSIS — I129 Hypertensive chronic kidney disease with stage 1 through stage 4 chronic kidney disease, or unspecified chronic kidney disease: Secondary | ICD-10-CM | POA: Diagnosis not present

## 2024-01-07 DIAGNOSIS — R7309 Other abnormal glucose: Secondary | ICD-10-CM | POA: Diagnosis not present

## 2024-01-07 DIAGNOSIS — E559 Vitamin D deficiency, unspecified: Secondary | ICD-10-CM | POA: Diagnosis not present

## 2024-01-14 DIAGNOSIS — H40053 Ocular hypertension, bilateral: Secondary | ICD-10-CM | POA: Diagnosis not present

## 2024-01-14 DIAGNOSIS — H2513 Age-related nuclear cataract, bilateral: Secondary | ICD-10-CM | POA: Diagnosis not present

## 2024-02-06 ENCOUNTER — Ambulatory Visit: Payer: Medicare Other | Admitting: Dermatology

## 2024-03-05 DIAGNOSIS — L2081 Atopic neurodermatitis: Secondary | ICD-10-CM | POA: Diagnosis not present

## 2024-03-17 DIAGNOSIS — L308 Other specified dermatitis: Secondary | ICD-10-CM | POA: Diagnosis not present

## 2024-03-17 NOTE — Progress Notes (Signed)
  Cardiology Office Note:  .   Date:  03/25/2024  ID:  Scott Graves, DOB [birth year], MRN [id number] PCP: Jonathon Neighbors, MD  [facility] Providers Cardiologist:  Kyra Phy, MD    History of Present Illness: .   Scott Graves is a 73 y.o. male with history of HTN, HLD, CKD. Last seen 06/2023 and doing well.  Patient here for f/u. Denies chest pain, dyspnea, palpitations, edema. He's statin intolerant. Doesn't know family history. He lifts weights 3 times a week. Was using a stationary bike but had a hip injury and stopped.  ROS:    Studies Reviewed: Aaron Aas    EKG Interpretation Date/Time:  Tuesday Mar 25 2024 11:16:47 EDT Ventricular Rate:  93 PR Interval:  164 QRS Duration:  82 QT Interval:  322 QTC Calculation: 400 R Axis:   67  Text Interpretation: Normal sinus rhythm Minimal voltage criteria for LVH, may be normal variant ( Sokolow-Lyon ) When compared with ECG of 28-Jul-2021 03:53, No significant change was found Confirmed by Theotis Flake [phone]) on 03/25/2024 11:24:24 AM    Prior CV Studies:       Risk Assessment/Calculations:             Physical Exam:   VS:  BP 113/73   Pulse 93   Ht 5\' 8"  (1.727 m)   Wt 168 lb 9.6 oz (76.5 kg)   SpO2 98%   BMI 25.64 kg/m    Wt Readings from Last 3 Encounters:  03/25/24 168 lb 9.6 oz (76.5 kg)  06/27/23 166 lb (75.3 kg)  12/25/22 168 lb 9.6 oz (76.5 kg)    GEN: Well nourished, well developed in no acute distress NECK: No JVD; No carotid bruits CARDIAC:  RRR, S4 1/6 systolic murmur LSB RESPIRATORY:  Clear to auscultation without rales, wheezing or rhonchi  ABDOMEN: Soft, non-tender, non-distended EXTREMITIES:  No edema; No deformity   ASSESSMENT AND PLAN: .     HTN-well controlled on Micardis  HCT   HLD-muscle aches on statins. Thinks he tolerated zocor in the past but was recalled at the time and stopped. Recommend Coronary calcium  score which he is willing to do.  CKD-Crt 1.49 12/2023 followed by renal         Dispo: f/u in 1 yr  Signed, Theotis Flake, PA-C

## 2024-03-25 ENCOUNTER — Ambulatory Visit: Attending: Physician Assistant | Admitting: Physician Assistant

## 2024-03-25 ENCOUNTER — Encounter: Payer: Self-pay | Admitting: Physician Assistant

## 2024-03-25 VITALS — BP 113/73 | HR 93 | Ht 68.0 in | Wt 168.6 lb

## 2024-03-25 DIAGNOSIS — N182 Chronic kidney disease, stage 2 (mild): Secondary | ICD-10-CM | POA: Insufficient documentation

## 2024-03-25 DIAGNOSIS — I10 Essential (primary) hypertension: Secondary | ICD-10-CM | POA: Insufficient documentation

## 2024-03-25 DIAGNOSIS — E7849 Other hyperlipidemia: Secondary | ICD-10-CM | POA: Insufficient documentation

## 2024-03-25 NOTE — Patient Instructions (Signed)
 Medication Instructions:  Your physician recommends that you continue on your current medications as directed. Please refer to the Current Medication list given to you today.  *If you need a refill on your cardiac medications before your next appointment, please call your pharmacy*  Lab Work: NONE If you have labs (blood work) drawn today and your tests are completely normal, you will receive your results only by: MyChart Message (if you have MyChart) OR A paper copy in the mail If you have any lab test that is abnormal or we need to change your treatment, we will call you to review the results.  Testing/Procedures: YOUR PROVIDER RECOMMENDS THAT YOU HAVE A CALCIUM  SCORE. THIS TEST IS SELF PAY ONLY AND IS NOT COVERED BY INSURANCE.  Follow-Up: At [facility], you and your health needs are our priority.  As part of our continuing mission to provide you with exceptional heart care, our providers are all part of one team.  This team includes your primary Cardiologist (physician) and Advanced Practice Providers or APPs (Physician Assistants and Nurse Practitioners) who all work together to provide you with the care you need, when you need it.  Your next appointment:   1 year(s)  Provider:   Arun K Thukkani, MD We recommend signing up for the patient portal called "MyChart".  Sign up information is provided on this After Visit Summary.  MyChart is used to connect with patients for Virtual Visits (Telemedicine).  Patients are able to view lab/test results, encounter notes, upcoming appointments, etc.  Non-urgent messages can be sent to your provider as well.   To learn more about what you can do with MyChart, go to [email or web address].   Other Instructions

## 2024-04-17 ENCOUNTER — Other Ambulatory Visit (HOSPITAL_BASED_OUTPATIENT_CLINIC_OR_DEPARTMENT_OTHER)

## 2024-04-30 DIAGNOSIS — N401 Enlarged prostate with lower urinary tract symptoms: Secondary | ICD-10-CM | POA: Diagnosis not present

## 2024-04-30 DIAGNOSIS — R3914 Feeling of incomplete bladder emptying: Secondary | ICD-10-CM | POA: Diagnosis not present

## 2024-04-30 DIAGNOSIS — Z125 Encounter for screening for malignant neoplasm of prostate: Secondary | ICD-10-CM | POA: Diagnosis not present

## 2024-05-02 DIAGNOSIS — E1122 Type 2 diabetes mellitus with diabetic chronic kidney disease: Secondary | ICD-10-CM | POA: Diagnosis not present

## 2024-05-02 DIAGNOSIS — E559 Vitamin D deficiency, unspecified: Secondary | ICD-10-CM | POA: Diagnosis not present

## 2024-05-02 DIAGNOSIS — N182 Chronic kidney disease, stage 2 (mild): Secondary | ICD-10-CM | POA: Diagnosis not present

## 2024-05-02 DIAGNOSIS — I10 Essential (primary) hypertension: Secondary | ICD-10-CM | POA: Diagnosis not present

## 2024-05-02 DIAGNOSIS — E782 Mixed hyperlipidemia: Secondary | ICD-10-CM | POA: Diagnosis not present

## 2024-05-06 DIAGNOSIS — I129 Hypertensive chronic kidney disease with stage 1 through stage 4 chronic kidney disease, or unspecified chronic kidney disease: Secondary | ICD-10-CM | POA: Diagnosis not present

## 2024-05-06 DIAGNOSIS — N189 Chronic kidney disease, unspecified: Secondary | ICD-10-CM | POA: Diagnosis not present

## 2024-05-06 DIAGNOSIS — R7309 Other abnormal glucose: Secondary | ICD-10-CM | POA: Diagnosis not present

## 2024-05-06 DIAGNOSIS — E785 Hyperlipidemia, unspecified: Secondary | ICD-10-CM | POA: Diagnosis not present

## 2024-05-06 DIAGNOSIS — D649 Anemia, unspecified: Secondary | ICD-10-CM | POA: Diagnosis not present

## 2024-07-23 DIAGNOSIS — R300 Dysuria: Secondary | ICD-10-CM | POA: Diagnosis not present

## 2024-07-23 DIAGNOSIS — R3912 Poor urinary stream: Secondary | ICD-10-CM | POA: Diagnosis not present

## 2024-07-23 DIAGNOSIS — N401 Enlarged prostate with lower urinary tract symptoms: Secondary | ICD-10-CM | POA: Diagnosis not present

## 2024-08-07 DIAGNOSIS — Q615 Medullary cystic kidney: Secondary | ICD-10-CM | POA: Diagnosis not present

## 2024-08-07 DIAGNOSIS — I129 Hypertensive chronic kidney disease with stage 1 through stage 4 chronic kidney disease, or unspecified chronic kidney disease: Secondary | ICD-10-CM | POA: Diagnosis not present

## 2024-08-07 DIAGNOSIS — M109 Gout, unspecified: Secondary | ICD-10-CM | POA: Diagnosis not present

## 2024-08-07 DIAGNOSIS — N1831 Chronic kidney disease, stage 3a: Secondary | ICD-10-CM | POA: Diagnosis not present

## 2024-08-07 DIAGNOSIS — D649 Anemia, unspecified: Secondary | ICD-10-CM | POA: Diagnosis not present

## 2024-08-07 DIAGNOSIS — N182 Chronic kidney disease, stage 2 (mild): Secondary | ICD-10-CM | POA: Diagnosis not present

## 2024-09-01 DIAGNOSIS — L820 Inflamed seborrheic keratosis: Secondary | ICD-10-CM | POA: Diagnosis not present

## 2024-09-11 DIAGNOSIS — R7309 Other abnormal glucose: Secondary | ICD-10-CM | POA: Diagnosis not present

## 2024-09-11 DIAGNOSIS — I10 Essential (primary) hypertension: Secondary | ICD-10-CM | POA: Diagnosis not present

## 2024-09-11 DIAGNOSIS — E785 Hyperlipidemia, unspecified: Secondary | ICD-10-CM | POA: Diagnosis not present

## 2024-09-12 DIAGNOSIS — N189 Chronic kidney disease, unspecified: Secondary | ICD-10-CM | POA: Diagnosis not present

## 2024-09-12 DIAGNOSIS — I129 Hypertensive chronic kidney disease with stage 1 through stage 4 chronic kidney disease, or unspecified chronic kidney disease: Secondary | ICD-10-CM | POA: Diagnosis not present

## 2024-09-12 DIAGNOSIS — E785 Hyperlipidemia, unspecified: Secondary | ICD-10-CM | POA: Diagnosis not present

## 2024-09-12 DIAGNOSIS — D649 Anemia, unspecified: Secondary | ICD-10-CM | POA: Diagnosis not present

## 2024-09-12 DIAGNOSIS — R7309 Other abnormal glucose: Secondary | ICD-10-CM | POA: Diagnosis not present

## 2024-09-25 DIAGNOSIS — Z23 Encounter for immunization: Secondary | ICD-10-CM | POA: Diagnosis not present
